# Patient Record
Sex: Female | Born: 1970 | Race: White | Hispanic: No | Marital: Single | Smoking: Former smoker
Health system: Southern US, Community
[De-identification: ages and names within clinical notes are randomized; demographics above are authoritative.]

## PROBLEM LIST (undated history)

## (undated) DIAGNOSIS — E039 Hypothyroidism, unspecified: Secondary | ICD-10-CM

## (undated) DIAGNOSIS — N901 Moderate vulvar dysplasia: Secondary | ICD-10-CM

## (undated) DIAGNOSIS — B20 Human immunodeficiency virus [HIV] disease: Secondary | ICD-10-CM

## (undated) DIAGNOSIS — Z21 Asymptomatic human immunodeficiency virus [HIV] infection status: Secondary | ICD-10-CM

## (undated) DIAGNOSIS — K219 Gastro-esophageal reflux disease without esophagitis: Secondary | ICD-10-CM

## (undated) HISTORY — DX: Moderate vulvar dysplasia: N90.1

## (undated) HISTORY — PX: CHOLECYSTECTOMY: SHX55

## (undated) HISTORY — PX: TUBAL LIGATION: SHX77

## (undated) HISTORY — PX: APPENDECTOMY: SHX54

## (undated) HISTORY — PX: ABDOMINAL HYSTERECTOMY: SHX81

## (undated) HISTORY — PX: LAPAROSCOPIC SUPRACERVICAL HYSTERECTOMY: SHX5399

---

## 2006-09-26 ENCOUNTER — Ambulatory Visit: Payer: Self-pay | Admitting: Internal Medicine

## 2006-10-18 ENCOUNTER — Ambulatory Visit: Payer: Self-pay | Admitting: General Surgery

## 2009-07-11 IMAGING — US ABDOMEN ULTRASOUND
1 series · 17 of 25 positions shown · non-contrast
Comparison: none

REASON FOR EXAM: Abdominal Pain
COMMENTS:

[Series 1: abdomen ultrasound · 17 of 71 slices shown]
[im 1/71]
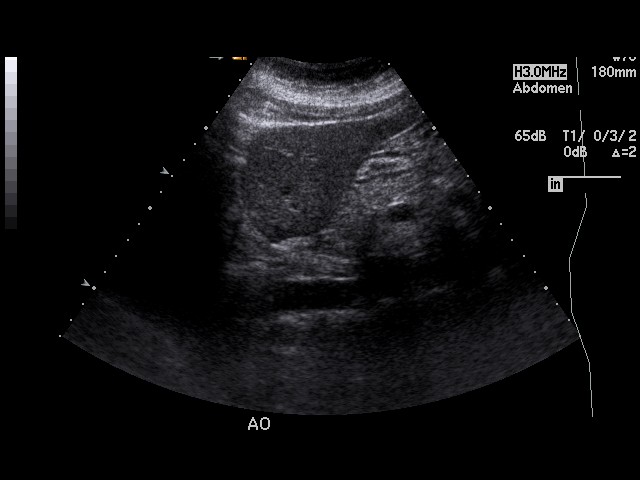
[im 6/71]
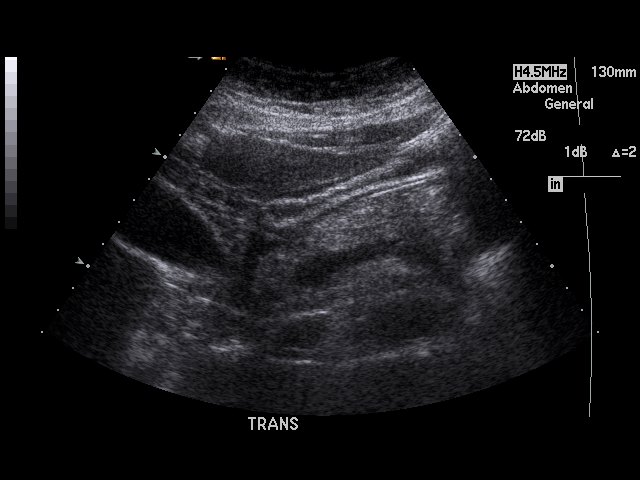
[im 9/71]
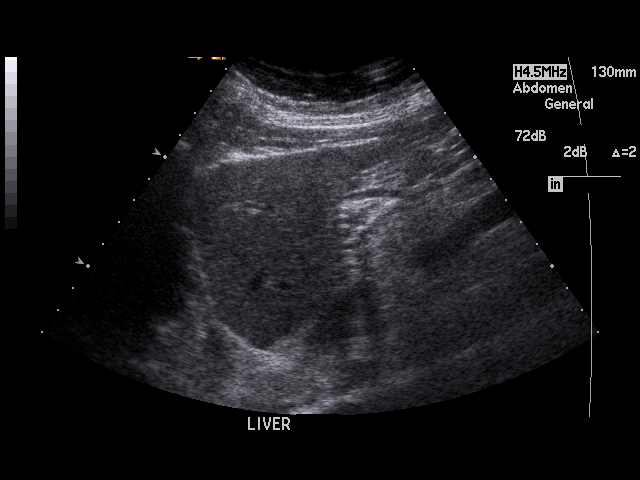
[im 15/71]
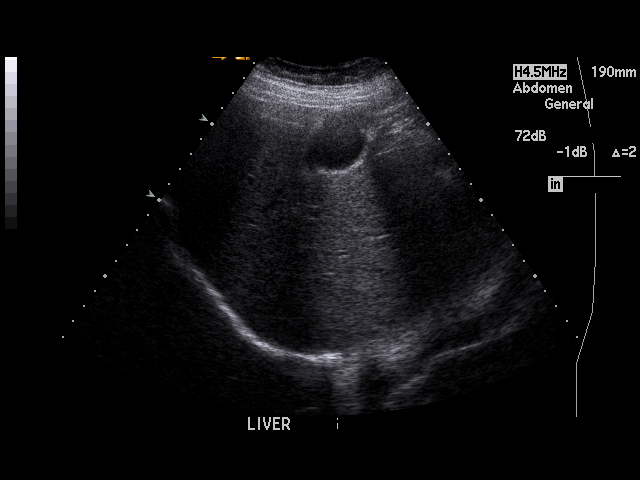
[im 18/71]
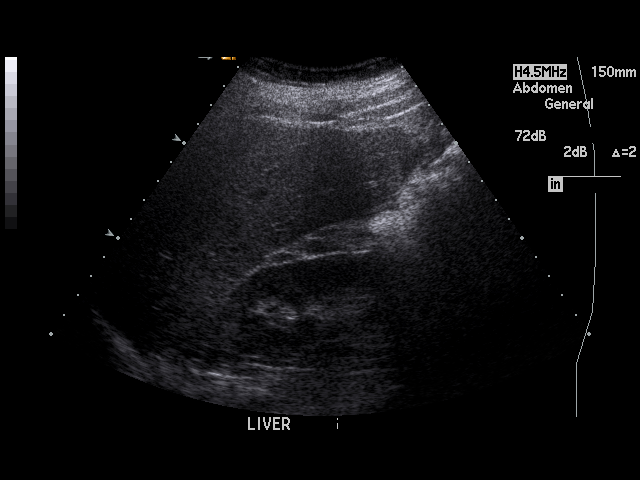
[im 24/71]
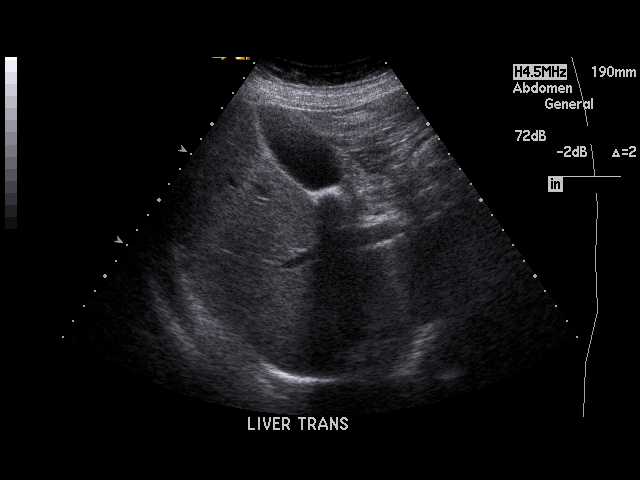
[im 27/71]
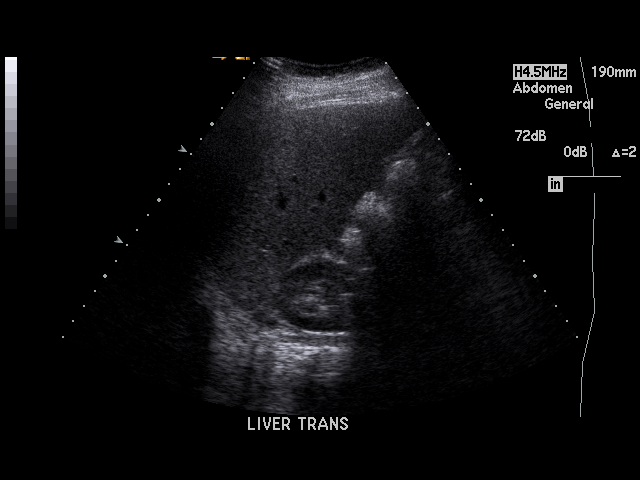
[im 33/71]
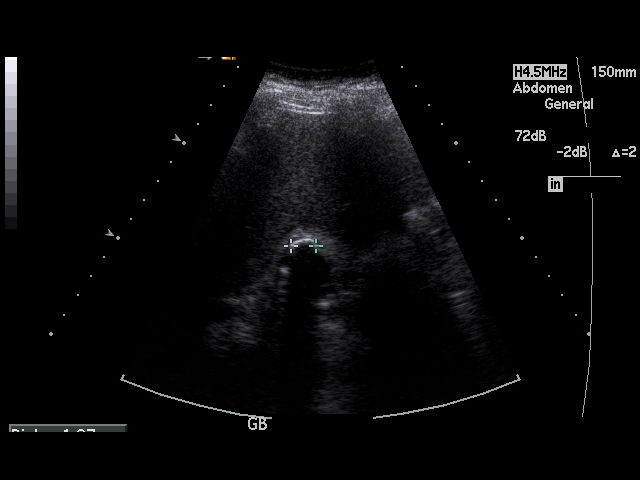
[im 36/71]
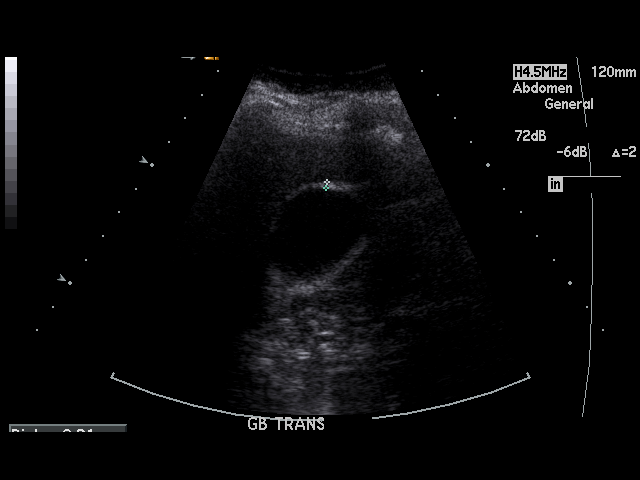
[im 38/71]
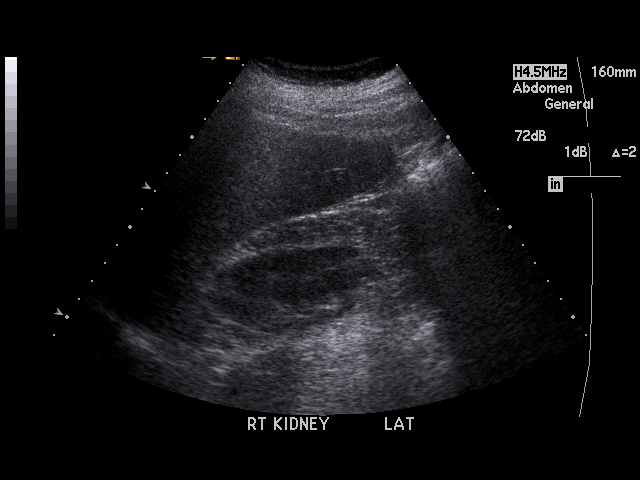
[im 44/71]
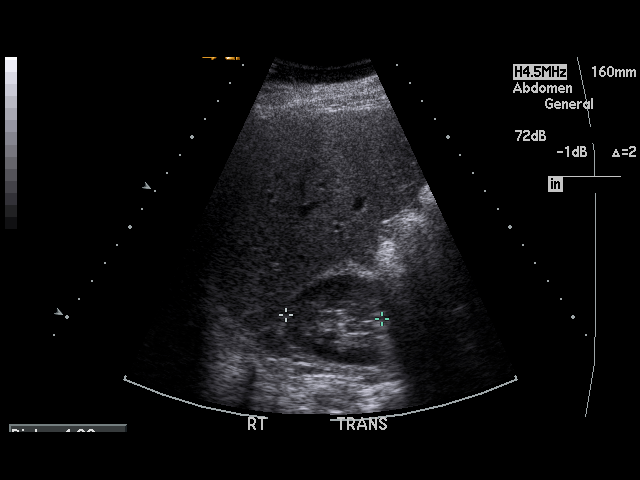
[im 47/71]
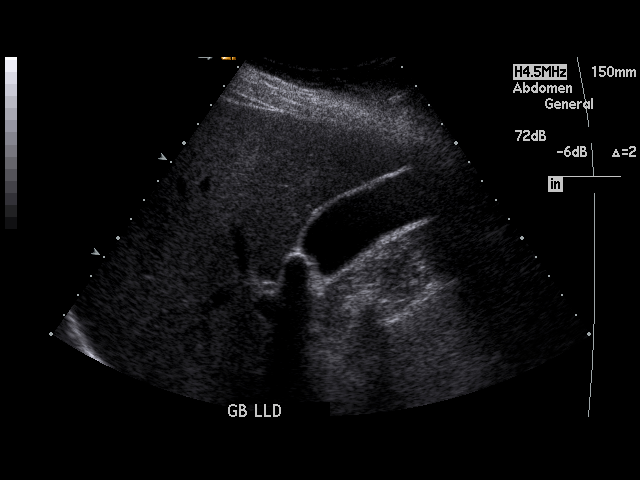
[im 53/71]
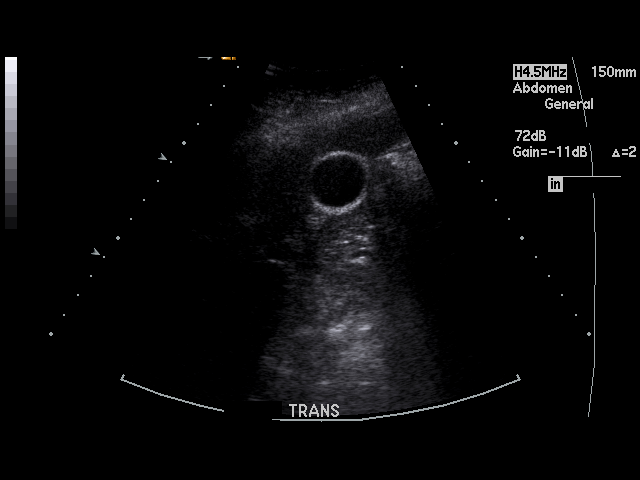
[im 56/71]
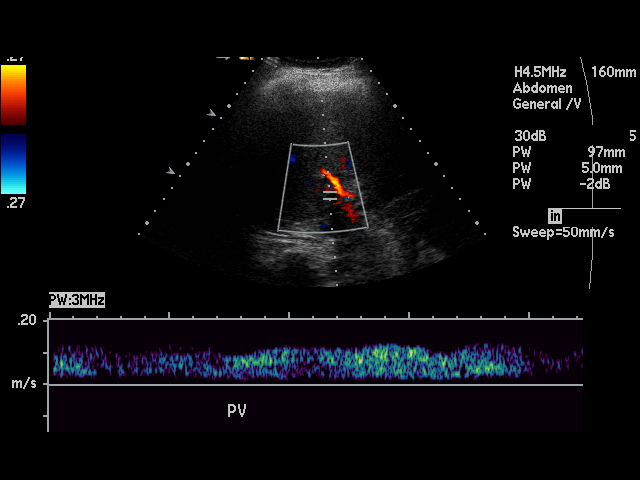
[im 62/71]
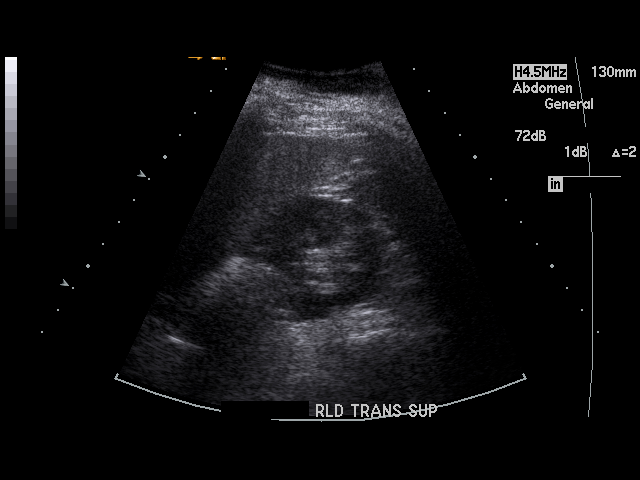
[im 65/71]
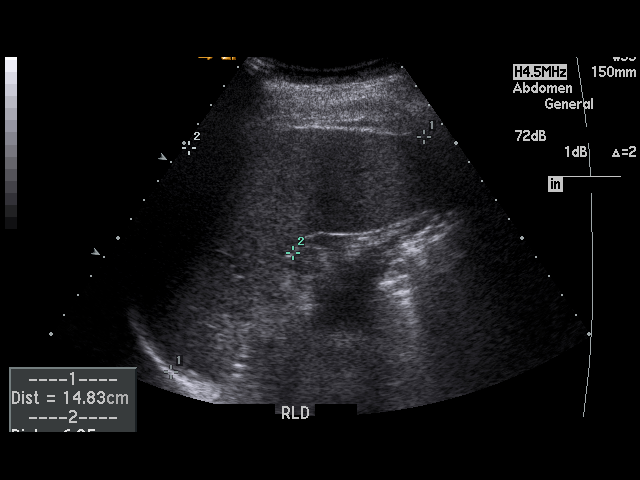
[im 71/71]
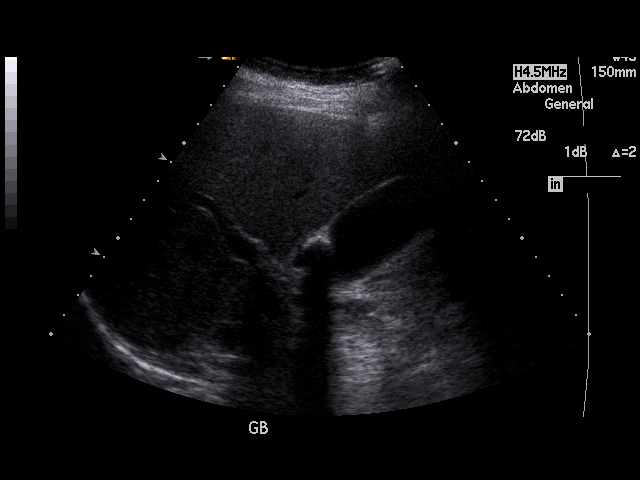

[17 of 25 positions shown; findings below may reference images not displayed]

PROCEDURE:     US  - US ABDOMEN GENERAL SURVEY  - September 26, 2006  [DATE]

RESULT:     The liver, abdominal aorta and pancreas are normal in
appearance. The spleen is enlarged. The spleen measures 14.83 cm x 7.93 cm.
No focal splenic mass lesions are noted. There is observed an echodensity in
the neck of the gallbladder. This does not move as the patient changes
position and may be lodged in the neck or possibly lodging is imminent.
Currently, there is no thickening of the gallbladder wall to indicate acute
cholecystitis. The common bile duct measures 4.7 mm in diameter, which is
within normal limits. The kidneys show no hydronephrosis. There is no
ascites.
IMPRESSION: 1. Cholelithiasis. There is a stone in the gallbladder neck which does not
move as the patient changes position.
2. No thickening of the gallbladder wall is currently seen.
3. Splenomegaly.

## 2011-06-24 DIAGNOSIS — J309 Allergic rhinitis, unspecified: Secondary | ICD-10-CM | POA: Insufficient documentation

## 2014-01-07 ENCOUNTER — Ambulatory Visit: Payer: Self-pay | Admitting: Obstetrics and Gynecology

## 2014-01-07 LAB — COMPREHENSIVE METABOLIC PANEL
Albumin: 4.1 g/dL (ref 3.4–5.0)
Alkaline Phosphatase: 34 U/L — ABNORMAL LOW
Anion Gap: 5 — ABNORMAL LOW (ref 7–16)
BUN: 7 mg/dL (ref 7–18)
Bilirubin,Total: 1.1 mg/dL — ABNORMAL HIGH (ref 0.2–1.0)
Calcium, Total: 8.9 mg/dL (ref 8.5–10.1)
Chloride: 109 mmol/L — ABNORMAL HIGH (ref 98–107)
Co2: 26 mmol/L (ref 21–32)
Creatinine: 0.82 mg/dL (ref 0.60–1.30)
Glucose: 104 mg/dL — ABNORMAL HIGH (ref 65–99)
Osmolality: 278 (ref 275–301)
Potassium: 3.8 mmol/L (ref 3.5–5.1)
SGOT(AST): 12 U/L — ABNORMAL LOW (ref 15–37)
SGPT (ALT): 21 U/L
SODIUM: 140 mmol/L (ref 136–145)
Total Protein: 7.4 g/dL (ref 6.4–8.2)

## 2014-01-07 LAB — APTT: Activated PTT: 26.8 secs (ref 23.6–35.9)

## 2014-01-07 LAB — HEMOGLOBIN: HGB: 15.2 g/dL (ref 12.0–16.0)

## 2014-01-07 LAB — PROTIME-INR
INR: 1.1
Prothrombin Time: 14.3 secs (ref 11.5–14.7)

## 2014-01-13 ENCOUNTER — Ambulatory Visit: Payer: Self-pay | Admitting: Obstetrics and Gynecology

## 2014-01-14 LAB — BASIC METABOLIC PANEL
Anion Gap: 9 (ref 7–16)
BUN: 7 mg/dL (ref 7–18)
CALCIUM: 8.6 mg/dL (ref 8.5–10.1)
CHLORIDE: 108 mmol/L — AB (ref 98–107)
Co2: 21 mmol/L (ref 21–32)
Creatinine: 0.98 mg/dL (ref 0.60–1.30)
EGFR (African American): >60
EGFR (Non-African Amer.): >60
GLUCOSE: 163 mg/dL — AB (ref 65–99)
Osmolality: 277 (ref 275–301)
Potassium: 4.3 mmol/L (ref 3.5–5.1)
Sodium: 138 mmol/L (ref 136–145)

## 2014-01-14 LAB — HEMATOCRIT: HCT: 41.4 % (ref 35.0–47.0)

## 2014-05-24 NOTE — Op Note (Signed)
PATIENT NAME:  Jordan Frank, [name] MR#:  [id number] DATE OF BIRTH:  September 24, 1970  DATE OF PROCEDURE:  01/13/2014  PREOPERATIVE DIAGNOSES :  1.  Menorrhagia.  2.  Failed endometrial ablation.   POSTOPERATIVE DIAGNOSES :  1.  Menorrhagia.  2.  Failed endometrial ablation.   PROCEDURES:  1.  Laparoscopic supracervical hysterectomy.  2.  Bilateral salpingectomy.   ANESTHESIA:  General endotracheal anesthesia.   SURGEON: Suzy Bouchardhomas J. Schermerhorn, MD.     FIRST ASSISTANT: Cline CoolsBethany E. Beasley, MD.    INDICATIONS: This is a 44 year old gravida 3, para 2  patient with menorrhagia and dysmenorrhea. She is status post a NovaSure endometrial ablation which has not worked. The patient has also failed Depo-Provera and she desires definitive surgical intervention.   DESCRIPTION OF PROCEDURE: After adequate general endotracheal anesthesia the patient was placed in the dorsal supine position. [facility]s [facility]. The abdomen, perineum, and vagina were prepped and draped in normal sterile fashion. Speculum was placed in the vagina and a single-tooth tenaculum was applied on the anterior cervix and a uterine sound was placed into the endometrial cavity. The two were anchored together with Steri-Strips to be used for uterine manipulation during the procedure. A Foley catheter was placed yielding 75 mL clear urine. Gloves were changed and a 12 mm infraumbilical incision was made after injecting with 0.5% Marcaine. The laparoscope was advanced into the abdominal cavity under direct visualization with the Optiview cannula. No difficulty gaining access. The patient's abdomen was insufflated with carbon dioxide. An 11 mm port was placed at the left lower quadrant 3 cm medial to the left anterior iliac spine, under direct visualization the port was placed. There were several adhesions in the right lower quadrant resulting most likely from prior appendectomy.  Harmonic scalpel was brought up and these were  taken down sharply. The third port was placed in the right lower quadrant again 3 cm medial to the right anterior iliac spine and under direct visualization the 11 mm trocar was advanced without difficulty. Uterus was then grasped at the left cornua and placed on traction medially and the round ligament was then transected with the Harmonic scalpel and the uteroovarian ligament was then transected with the Harmonic scalpel and the broad ligament were dissected to the level of the cornua where the left uterine artery was skeletonized. Kleppinger was used to cauterize the uterine and then the Harmonic was used to transect.  A similar procedure was repeated on the patient's right side. After transecting the round ligaments and the uteroovarian ligament the broad ligament was dissected and again the right uterine artery was identified and cauterized with the Kleppingers and transected with the Harmonic scalpel. The cervix was then transected at the level of the uterosacral ligaments. The uterine sound was removed and once the transection had occurred the Kleppingers were used to cauterize the remaining endocervical stump. Good hemostasis was noted. The morcellator was brought up to the operative field and morcellation took place without difficulty. Fallopian tubes were also then removed with the Harmonic scalpel without difficulty. The abdomen was copiously irrigated. Good hemostasis was noted. Pressure was lowered to 6 mmHg and good hemostasis noted. Normal peristaltic activity of each ureter was identified. The procedure was then terminated after deflating the patient's abdomen and all instruments were removed. The right and left lower port sites were closed in 2 layers with the fascial layer with 2-0 Vicryl suture and all skin incisions were closed with interrupted 4-0 Vicryl suture.  Dermabond was placed at the skin and then Tegaderm placed over top of this. The single-tooth tenaculum was removed from the anterior  cervix, there was no active bleeding. The procedure was terminated.   ESTIMATED BLOOD LOSS: 50 mL.    INTRAOPERATIVE FLUIDS:  800 mL.   The patient tolerated the procedure well and was taken to the recovery room in good condition.    ____________________________ Suzy Bouchard, MD tjs:bu D: 01/13/2014 12:41:50 ET T: 01/13/2014 14:58:18 ET JOB#: [id number]  cc: Suzy Bouchard, MD, <Dictator> Suzy Bouchard MD ELECTRONICALLY SIGNED 01/15/2014 8:55

## 2014-05-26 LAB — SURGICAL PATHOLOGY

## 2015-02-18 DIAGNOSIS — M25512 Pain in left shoulder: Secondary | ICD-10-CM | POA: Insufficient documentation

## 2016-06-20 DIAGNOSIS — M8580 Other specified disorders of bone density and structure, unspecified site: Secondary | ICD-10-CM | POA: Insufficient documentation

## 2016-12-27 DIAGNOSIS — E7800 Pure hypercholesterolemia, unspecified: Secondary | ICD-10-CM | POA: Insufficient documentation

## 2016-12-27 DIAGNOSIS — E559 Vitamin D deficiency, unspecified: Secondary | ICD-10-CM | POA: Insufficient documentation

## 2017-05-01 NOTE — H&P (Signed)
[name] is a 47 y.o. female here for Excisional vulvar biopsies  pt underwent vulvar biopsies at 6oclock introitus( lichen sclerosus} And 8 oclock , inner aspect right labia minor ( VIN2)  Pt is HIV +    Past Medical History:  has a past medical history of Abdominal pain, unspecified site, Acute upper respiratory infections of unspecified site, Allergic rhinitis, cause unspecified, Allergic state, Asymptomatic human immunodeficiency virus (HIV) infection status (CMS-HCC) (Diagnosed 1994), Cervicitis and endocervicitis, Cholelithiasis, Dysmenorrhea, Hemochromatosis carrier, Hepatitis C, History of nephrolithiasis, Lichen sclerosus (04/11/2017), Menorrhagia, Primary hypothyroidism, unspecified, Pure hypercholesterolemia (12/27/2016), Rash and other nonspecific skin eruption, Seasonal allergies, Tobacco use disorder, and Uterine polyp.  Past Surgical History:  has a past surgical history that includes Laparoscopic cholecystectomy (2009 at [facility]); Endometrial Biopsy (01/14/2013); Endometrial ablation w/ novasure (02/28/2013); Appendectomy (1983 at [facility]); Tubal ligation (Bilateral, 2005 at [facility]); Cholecystectomy (10/2011); and Hysterectomy. Family History: family history includes Cancer in her other; Diabetes type II in her father and other; Heart disease in her other; High blood pressure (Hypertension) in her other; Thyroid disease in her other. Social History:  reports that she has been smoking cigarettes.  She has a 20.00 pack-year smoking history. She has never used smokeless tobacco. She reports that she drank alcohol. She reports that she does not use drugs. OB/GYN History:          OB History    Gravida  3   Para  2   Term  2   Preterm      AB  1   Living  2     SAB      TAB      Ectopic      Molar      Multiple      Live Births  2          Allergies: is allergic to amoxicillin; ciprofloxacin; codeine; and penicillins. Medications:  Current Outpatient  Medications:  .  clobetasol (TEMOVATE) 0.05 % ointment, Apply topically 2 (two) times daily, Disp: 45 g, Rfl: 1 .  GENVOYA 150-150-200-10 mg tablet, TAKE 1 TABLET BY MOUTH ONCE DAILY., Disp: 30 tablet, Rfl: 10 .  levothyroxine (SYNTHROID, LEVOTHROID) 125 MCG tablet, Take 1 tablet (125 mcg total) by mouth once daily Take on an empty stomach with a glass of water at least 30-60 minutes before breakfast., Disp: 90 tablet, Rfl: 3  Review of Systems: General:                      No fatigue or weight loss Eyes:                           No vision changes Ears:                            No hearing difficulty Respiratory:                No cough or shortness of breath Pulmonary:                  No asthma or shortness of breath Cardiovascular:           No chest pain, palpitations, dyspnea on exertion Gastrointestinal:          No abdominal bloating, chronic diarrhea, constipations, masses, pain or hematochezia Genitourinary:             No hematuria, dysuria,  abnormal vaginal discharge, pelvic pain, Menometrorrhagia Lymphatic:                   No swollen lymph nodes Musculoskeletal:         No muscle weakness Neurologic:                  No extremity weakness, syncope, seizure disorder Psychiatric:                  No history of depression, delusions or suicidal/homicidal ideation    Exam:      Vitals:   04/17/17 1621  BP: 119/87  Pulse: 94    Body mass index is 33.33 kg/m.  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 PapIG, HPV, rfx 16/18 - [facility]    If abnormal ok to run genotyping 16/18. Reflex code is [id number]. Dx code is [phone]    Return if symptoms worsen or fail to improve, for preop.  Vilma PraderHOMAS JANSE Chris Cripps, MD

## 2017-05-04 ENCOUNTER — Encounter: Admission: RE | Admit: 2017-05-04 | Payer: BC Managed Care – PPO | Source: Ambulatory Visit

## 2017-05-04 HISTORY — DX: Asymptomatic human immunodeficiency virus (hiv) infection status: Z21

## 2017-05-04 HISTORY — DX: Human immunodeficiency virus (HIV) disease: B20

## 2017-05-04 HISTORY — DX: Hypothyroidism, unspecified: E03.9

## 2017-05-05 ENCOUNTER — Other Ambulatory Visit: Payer: Self-pay

## 2017-05-05 ENCOUNTER — Encounter
Admission: RE | Admit: 2017-05-05 | Discharge: 2017-05-05 | Disposition: A | Discharge status: Home / Self Care | Payer: BC Managed Care – PPO | Source: Ambulatory Visit | Attending: Obstetrics and Gynecology | Admitting: Obstetrics and Gynecology

## 2017-05-05 ENCOUNTER — Encounter: Payer: Self-pay | Admitting: *Deleted

## 2017-05-05 HISTORY — DX: Gastro-esophageal reflux disease without esophagitis: K21.9

## 2017-05-05 NOTE — Patient Instructions (Signed)
Your procedure is scheduled on: 05-12-17 FRIDAY Report to Same Day Surgery 2nd floor medical mall [facility](Medical Mall Entrance-take elevator on left to 2nd floor.  Check in with surgery information desk.) To find out your arrival time please call [phone] between 1PM - 3PM on 05-11-17 THURSDAY  Remember: Instructions that are not followed completely may result in serious medical risk, up to and including death, or upon the discretion of your surgeon and anesthesiologist your surgery may need to be rescheduled.    _x___ 1. Do not eat food after midnight the night before your procedure. NO GUM OR CANDY AFTER MIDNIGHT.  You may drink clear liquids up to 2 hours before you are scheduled to arrive at the hospital for your procedure.  Do not drink clear liquids within 2 hours of your scheduled arrival to the hospital.  Clear liquids include  --Water or Apple juice without pulp  --Clear carbohydrate beverage such as ClearFast or Gatorade  --Black Coffee or Clear Tea (No milk, no creamers, do not add anything to  the coffee or Tea     __x__ 2. No Alcohol for 24 hours before or after surgery.   __x__3. No Smoking or e-cigarettes for 24 prior to surgery.  Do not use any chewable tobacco products for at least 6 hour prior to surgery   ____  4. Bring all medications with you on the day of surgery if instructed.    __x__ 5. Notify your doctor if there is any change in your medical condition     (cold, fever, infections).    x___6. On the morning of surgery brush your teeth with toothpaste and water.  You may rinse your mouth with mouth wash if you wish.  Do not swallow any toothpaste or mouthwash.   Do not wear jewelry, make-up, hairpins, clips or nail polish.  Do not wear lotions, powders, or perfumes. You may wear deodorant.  Do not shave 48 hours prior to surgery. Men may shave face and neck.  Do not bring valuables to the hospital.    [facility] is not responsible for any belongings or  valuables.               Contacts, dentures or bridgework may not be worn into surgery.  Leave your suitcase in the car. After surgery it may be brought to your room.  For patients admitted to the hospital, discharge time is determined by your treatment team.  _  Patients discharged the day of surgery will not be allowed to drive home.  You will need someone to drive you home and stay with you the night of your procedure.    Please read over the following fact sheets that you were given:   [facility] and or MRSA Information   _x___ TAKE THE FOLLOWING MEDICATION THE MORNING OF SURGERY. These include:  1. LEVOTHYROXINE  2.  3.  4.  5.  6.  ____Fleets enema or Magnesium Citrate as directed.   ____ Use CHG Soap or sage wipes as directed on instruction sheet   ____ Use inhalers on the day of surgery and bring to hospital day of surgery  ____ Stop Metformin and Janumet 2 days prior to surgery.    ____ Take 1/2 of usual insulin dose the night before surgery and none on the morning surgery.   ____ Follow recommendations from Cardiologist, Pulmonologist or PCP regarding stopping Aspirin, Coumadin, Plavix ,Eliquis, Effient, or Pradaxa, and Pletal.  X____Stop Anti-inflammatories such  as Advil, Aleve, Ibuprofen, Motrin, Naproxen, Naprosyn, Goodies powders or aspirin products NOW-OK to take Tylenol    ____ Stop supplements until after surgery.     ____ Bring C-Pap to the hospital.    

## 2017-05-11 ENCOUNTER — Encounter
Admission: RE | Admit: 2017-05-11 | Discharge: 2017-05-11 | Disposition: A | Discharge status: Home / Self Care | Payer: BC Managed Care – PPO | Source: Ambulatory Visit | Attending: Obstetrics and Gynecology | Admitting: Obstetrics and Gynecology

## 2017-05-11 DIAGNOSIS — Z885 Allergy status to narcotic agent status: Secondary | ICD-10-CM | POA: Diagnosis not present

## 2017-05-11 DIAGNOSIS — Z79890 Hormone replacement therapy (postmenopausal): Secondary | ICD-10-CM | POA: Diagnosis not present

## 2017-05-11 DIAGNOSIS — Z21 Asymptomatic human immunodeficiency virus [HIV] infection status: Secondary | ICD-10-CM | POA: Diagnosis not present

## 2017-05-11 DIAGNOSIS — E039 Hypothyroidism, unspecified: Secondary | ICD-10-CM | POA: Diagnosis not present

## 2017-05-11 DIAGNOSIS — Z880 Allergy status to penicillin: Secondary | ICD-10-CM | POA: Diagnosis not present

## 2017-05-11 DIAGNOSIS — Z79899 Other long term (current) drug therapy: Secondary | ICD-10-CM | POA: Diagnosis not present

## 2017-05-11 DIAGNOSIS — L900 Lichen sclerosus et atrophicus: Secondary | ICD-10-CM | POA: Diagnosis not present

## 2017-05-11 DIAGNOSIS — N901 Moderate vulvar dysplasia: Secondary | ICD-10-CM | POA: Diagnosis not present

## 2017-05-11 DIAGNOSIS — Z881 Allergy status to other antibiotic agents status: Secondary | ICD-10-CM | POA: Diagnosis not present

## 2017-05-11 LAB — CBC
HCT: 41.7 % (ref 35.0–47.0)
Hemoglobin: 15 g/dL (ref 12.0–16.0)
MCH: 35.8 pg — ABNORMAL HIGH (ref 26.0–34.0)
MCHC: 36 g/dL (ref 32.0–36.0)
MCV: 99.5 fL (ref 80.0–100.0)
PLATELETS: 257 10*3/uL (ref 150–440)
RBC: 4.19 MIL/uL (ref 3.80–5.20)
RDW: 12.6 % (ref 11.5–14.5)
WBC: 8.3 10*3/uL (ref 3.6–11.0)

## 2017-05-11 LAB — BASIC METABOLIC PANEL
Anion gap: 7 (ref 5–15)
BUN: 12 mg/dL (ref 6–20)
CO2: 24 mmol/L (ref 22–32)
CREATININE: 0.76 mg/dL (ref 0.44–1.00)
Calcium: 9.3 mg/dL (ref 8.9–10.3)
Chloride: 104 mmol/L (ref 101–111)
Glucose, Bld: 96 mg/dL (ref 65–99)
POTASSIUM: 3.8 mmol/L (ref 3.5–5.1)
SODIUM: 135 mmol/L (ref 135–145)

## 2017-05-11 LAB — TYPE AND SCREEN
ABO/RH(D): A NEG
ANTIBODY SCREEN: NEGATIVE

## 2017-05-12 ENCOUNTER — Ambulatory Visit: Payer: BC Managed Care – PPO | Admitting: Certified Registered"

## 2017-05-12 ENCOUNTER — Other Ambulatory Visit: Payer: Self-pay

## 2017-05-12 ENCOUNTER — Encounter
Admission: RE | Disposition: A | Discharge status: Home / Self Care | Payer: Self-pay | Source: Ambulatory Visit | Attending: Obstetrics and Gynecology

## 2017-05-12 ENCOUNTER — Ambulatory Visit
Admission: RE | Admit: 2017-05-12 | Discharge: 2017-05-12 | Disposition: A | Discharge status: Home / Self Care | Payer: BC Managed Care – PPO | Source: Ambulatory Visit | Attending: Obstetrics and Gynecology | Admitting: Obstetrics and Gynecology

## 2017-05-12 DIAGNOSIS — Z79899 Other long term (current) drug therapy: Secondary | ICD-10-CM | POA: Insufficient documentation

## 2017-05-12 DIAGNOSIS — N901 Moderate vulvar dysplasia: Secondary | ICD-10-CM | POA: Diagnosis not present

## 2017-05-12 DIAGNOSIS — Z880 Allergy status to penicillin: Secondary | ICD-10-CM | POA: Insufficient documentation

## 2017-05-12 DIAGNOSIS — Z21 Asymptomatic human immunodeficiency virus [HIV] infection status: Secondary | ICD-10-CM | POA: Insufficient documentation

## 2017-05-12 DIAGNOSIS — Z885 Allergy status to narcotic agent status: Secondary | ICD-10-CM | POA: Insufficient documentation

## 2017-05-12 DIAGNOSIS — Z79890 Hormone replacement therapy (postmenopausal): Secondary | ICD-10-CM | POA: Insufficient documentation

## 2017-05-12 DIAGNOSIS — Z881 Allergy status to other antibiotic agents status: Secondary | ICD-10-CM | POA: Insufficient documentation

## 2017-05-12 DIAGNOSIS — L900 Lichen sclerosus et atrophicus: Secondary | ICD-10-CM | POA: Insufficient documentation

## 2017-05-12 DIAGNOSIS — E039 Hypothyroidism, unspecified: Secondary | ICD-10-CM | POA: Insufficient documentation

## 2017-05-12 HISTORY — PX: LESION REMOVAL: SHX5196

## 2017-05-12 SURGERY — EXCISION, LESION, VAGINA
Anesthesia: General

## 2017-05-12 MED ORDER — PROPOFOL 10 MG/ML IV BOLUS
INTRAVENOUS | Status: AC
  Filled 2017-05-12: qty 20

## 2017-05-12 MED ORDER — LIDOCAINE-EPINEPHRINE 1 %-1:100000 IJ SOLN
INTRAMUSCULAR | Status: AC
  Filled 2017-05-12: qty 1

## 2017-05-12 MED ORDER — LIDOCAINE-EPINEPHRINE (PF) 1 %-1:200000 IJ SOLN
INTRAMUSCULAR | Status: AC
  Filled 2017-05-12: qty 30

## 2017-05-12 MED ORDER — SEVOFLURANE IN SOLN
RESPIRATORY_TRACT | Status: AC
  Filled 2017-05-12: qty 250

## 2017-05-12 MED ORDER — CEFAZOLIN SODIUM-DEXTROSE 1-4 GM/50ML-% IV SOLN
INTRAVENOUS | Status: DC | PRN
  Administered 2017-05-12: 1 g via INTRAVENOUS

## 2017-05-12 MED ORDER — FAMOTIDINE 20 MG PO TABS
20.0000 mg | ORAL_TABLET | Freq: Once | ORAL | Status: AC
  Administered 2017-05-12: 20 mg via ORAL

## 2017-05-12 MED ORDER — DEXAMETHASONE SODIUM PHOSPHATE 10 MG/ML IJ SOLN
INTRAMUSCULAR | Status: DC | PRN
  Administered 2017-05-12: 10 mg via INTRAVENOUS

## 2017-05-12 MED ORDER — ACETAMINOPHEN 10 MG/ML IV SOLN
INTRAVENOUS | Status: DC | PRN
Start: 2017-05-12 — End: 2017-05-12
  Administered 2017-05-12: 1000 mg via INTRAVENOUS

## 2017-05-12 MED ORDER — HYDROMORPHONE HCL 1 MG/ML IJ SOLN
INTRAMUSCULAR | Status: DC | PRN
  Administered 2017-05-12: 1 mg via INTRAVENOUS

## 2017-05-12 MED ORDER — ACETIC ACID 3 % SOLN
Status: AC
  Filled 2017-05-12: qty 500

## 2017-05-12 MED ORDER — GLYCOPYRROLATE 0.2 MG/ML IJ SOLN
INTRAMUSCULAR | Status: DC | PRN
  Administered 2017-05-12: 0.1 mg via INTRAVENOUS

## 2017-05-12 MED ORDER — MIDAZOLAM HCL 2 MG/2ML IJ SOLN
INTRAMUSCULAR | Status: DC | PRN
  Administered 2017-05-12: 4 mg via INTRAVENOUS

## 2017-05-12 MED ORDER — ONDANSETRON HCL 4 MG/2ML IJ SOLN
INTRAMUSCULAR | Status: DC | PRN
  Administered 2017-05-12: 4 mg via INTRAVENOUS

## 2017-05-12 MED ORDER — ONDANSETRON HCL 4 MG/2ML IJ SOLN
INTRAMUSCULAR | Status: AC
Start: 2017-05-12 — End: 2017-05-12
  Filled 2017-05-12: qty 2

## 2017-05-12 MED ORDER — FENTANYL CITRATE (PF) 100 MCG/2ML IJ SOLN: 25.0000 ug | INTRAMUSCULAR | Status: DC | PRN

## 2017-05-12 MED ORDER — PROPOFOL 10 MG/ML IV BOLUS
INTRAVENOUS | Status: DC | PRN
  Administered 2017-05-12: 200 mg via INTRAVENOUS

## 2017-05-12 MED ORDER — LACTATED RINGERS IV SOLN
INTRAVENOUS | Status: DC
  Administered 2017-05-12 (×2): via INTRAVENOUS

## 2017-05-12 MED ORDER — DIPHENHYDRAMINE HCL 50 MG/ML IJ SOLN
INTRAMUSCULAR | Status: DC | PRN
  Administered 2017-05-12: 12.5 mg via INTRAVENOUS

## 2017-05-12 MED ORDER — FAMOTIDINE 20 MG PO TABS
ORAL_TABLET | ORAL | Status: AC
  Administered 2017-05-12: 20 mg via ORAL
  Filled 2017-05-12: qty 1

## 2017-05-12 MED ORDER — HYDROMORPHONE HCL 1 MG/ML IJ SOLN
INTRAMUSCULAR | Status: AC
  Filled 2017-05-12: qty 1

## 2017-05-12 MED ORDER — MIDAZOLAM HCL 2 MG/2ML IJ SOLN
INTRAMUSCULAR | Status: AC
  Filled 2017-05-12: qty 4

## 2017-05-12 MED ORDER — GLYCOPYRROLATE 0.2 MG/ML IJ SOLN
INTRAMUSCULAR | Status: AC
  Filled 2017-05-12: qty 1

## 2017-05-12 MED ORDER — DEXAMETHASONE SODIUM PHOSPHATE 10 MG/ML IJ SOLN
INTRAMUSCULAR | Status: AC
Start: 2017-05-12 — End: 2017-05-12
  Filled 2017-05-12: qty 1

## 2017-05-12 MED ORDER — LIDOCAINE-EPINEPHRINE 1 %-1:100000 IJ SOLN
INTRAMUSCULAR | Status: DC | PRN
  Administered 2017-05-12: 12 mL

## 2017-05-12 MED ORDER — LIDOCAINE HCL (CARDIAC) 20 MG/ML IV SOLN
INTRAVENOUS | Status: DC | PRN
  Administered 2017-05-12: 50 mg via INTRAVENOUS

## 2017-05-12 MED ORDER — LIDOCAINE HCL (PF) 2 % IJ SOLN
INTRAMUSCULAR | Status: AC
  Filled 2017-05-12: qty 10

## 2017-05-12 MED ORDER — VASOPRESSIN 20 UNIT/ML IV SOLN
INTRAVENOUS | Status: AC
  Filled 2017-05-12: qty 1

## 2017-05-12 MED ORDER — ACETAMINOPHEN NICU IV SYRINGE 10 MG/ML
INTRAVENOUS | Status: AC
  Filled 2017-05-12: qty 1

## 2017-05-12 MED ORDER — SODIUM CHLORIDE 0.9 % IJ SOLN
INTRAMUSCULAR | Status: AC
  Filled 2017-05-12: qty 50

## 2017-05-12 MED ORDER — ACETIC ACID 4% SOLUTION
Status: DC | PRN
  Administered 2017-05-12: 1 via TOPICAL

## 2017-05-12 MED ORDER — ONDANSETRON HCL 4 MG/2ML IJ SOLN: 4.0000 mg | Freq: Once | INTRAMUSCULAR | Status: DC | PRN

## 2017-05-12 MED ORDER — DIPHENHYDRAMINE HCL 50 MG/ML IJ SOLN
INTRAMUSCULAR | Status: AC
  Filled 2017-05-12: qty 1

## 2017-05-12 MED ORDER — SILVER NITRATE-POT NITRATE 75-25 % EX MISC
CUTANEOUS | Status: AC
  Filled 2017-05-12: qty 5

## 2017-05-12 MED ORDER — LACTATED RINGERS IV SOLN: INTRAVENOUS | Status: DC

## 2017-05-12 SURGICAL SUPPLY — 27 items
BLADE SURG 15 STRL LF DISP TIS (BLADE) ×1 IMPLANT
BLADE SURG 15 STRL SS (BLADE) ×2
BLADE SURG SZ10 CARB STEEL (BLADE) ×3 IMPLANT
CATH ROBINSON RED A/P 16FR (CATHETERS) ×3 IMPLANT
DRAPE UNDER BUTTOCK W/FLU (DRAPES) ×3 IMPLANT
ELECT REM PT RETURN 9FT ADLT (ELECTROSURGICAL) ×3
ELECTRODE REM PT RTRN 9FT ADLT (ELECTROSURGICAL) ×1 IMPLANT
GLOVE BIO SURGEON STRL SZ8 (GLOVE) ×3 IMPLANT
GOWN STRL REUS W/ TWL LRG LVL3 (GOWN DISPOSABLE) ×1 IMPLANT
GOWN STRL REUS W/ TWL XL LVL3 (GOWN DISPOSABLE) ×1 IMPLANT
GOWN STRL REUS W/TWL LRG LVL3 (GOWN DISPOSABLE) ×2
GOWN STRL REUS W/TWL XL LVL3 (GOWN DISPOSABLE) ×2
KIT TURNOVER CYSTO (KITS) ×3 IMPLANT
LABEL OR SOLS (LABEL) ×3 IMPLANT
NEEDLE HYPO 22GX1.5 SAFETY (NEEDLE) ×3 IMPLANT
NS IRRIG 500ML POUR BTL (IV SOLUTION) ×3 IMPLANT
PACK BASIN MINOR ARMC (MISCELLANEOUS) ×3 IMPLANT
PAD OB MATERNITY 4.3X12.25 (PERSONAL CARE ITEMS) ×3 IMPLANT
PAD PREP 24X41 OB/GYN DISP (PERSONAL CARE ITEMS) ×3 IMPLANT
SPONGE XRAY 4X4 16PLY STRL (MISCELLANEOUS) ×3 IMPLANT
SUT VIC AB 3-0 SH 27 (SUTURE) ×6
SUT VIC AB 3-0 SH 27X BRD (SUTURE) ×3 IMPLANT
SUT VIC AB 4-0 FS2 27 (SUTURE) IMPLANT
SUT VIC AB 4-0 SH 27 (SUTURE) ×4
SUT VIC AB 4-0 SH 27XANBCTRL (SUTURE) ×2 IMPLANT
SUT VICRYL AB 3-0 FS1 BRD 27IN (SUTURE) ×3 IMPLANT
SYR CONTROL 10ML (SYRINGE) ×3 IMPLANT

## 2017-05-12 NOTE — Anesthesia Post-op Follow-up Note (Signed)
Anesthesia QCDR form completed.        

## 2017-05-12 NOTE — Anesthesia Preprocedure Evaluation (Signed)
Anesthesia Evaluation  Patient identified by MRN, date of birth, ID band Patient awake    Reviewed: Allergy & Precautions, NPO status , Patient's Chart, lab work & pertinent test results  Airway Mallampati: III  TM Distance: <3 FB     Dental  (+) Chipped, Caps   Pulmonary former smoker,    Pulmonary exam normal        Cardiovascular negative cardio ROS Normal cardiovascular exam     Neuro/Psych negative neurological ROS  negative psych ROS   GI/Hepatic GERD  Controlled,  Endo/Other  Hypothyroidism   Renal/GU   negative genitourinary   Musculoskeletal negative musculoskeletal ROS (+)   Abdominal Normal abdominal exam  (+)   Peds negative pediatric ROS (+)  Hematology negative hematology ROS (+)   Anesthesia Other Findings   Reproductive/Obstetrics                             Anesthesia Physical Anesthesia Plan  ASA: III  Anesthesia Plan: General   Post-op Pain Management:    Induction: Intravenous  PONV Risk Score and Plan:   Airway Management Planned: LMA  Additional Equipment:   Intra-op Plan:   Post-operative Plan: Extubation in OR  Informed Consent: I have reviewed the patients History and Physical, chart, labs and discussed the procedure including the risks, benefits and alternatives for the proposed anesthesia with the patient or authorized representative who has indicated his/her understanding and acceptance.   Dental advisory given  Plan Discussed with: CRNA and Surgeon  Anesthesia Plan Comments:         Anesthesia Quick Evaluation

## 2017-05-12 NOTE — Op Note (Signed)
NAMKristin Bruins:  Holzman, Gyselle                ACCOUNT NO.:  [id number]  MEDICAL RECORD NO.:  [id number]  LOCATION:  [facility]                         FACILITY:  [facility]  PHYSICIAN:  Jennell Cornerhomas Myda Detwiler, MDDATE OF BIRTH:  12/01/1970  DATE OF PROCEDURE:  05/12/2017 DATE OF DISCHARGE:                              OPERATIVE REPORT   PREOPERATIVE DIAGNOSIS:  Vulvar intraepithelial neoplasia, grade 2.  POSTOPERATIVE DIAGNOSIS:  Vulvar intraepithelial neoplasia, grade 2.  PROCEDURE PERFORMED:  Wide excisional vulvar biopsies, right and left.  SURGEON:  Jennell Cornerhomas Macyn Remmert, MD  ANESTHESIA:  General endotracheal anesthesia.  FIRST ASSISTANT:  Scrub tech.  INDICATION:  A 47 year old gravida 3, para 2 patient underwent vulvar biopsies in the office for hyperpigmentation that showed VIN grade 2.  DESCRIPTION OF PROCEDURE:  After adequate general endotracheal anesthesia, the patient's was prepped and draped in normal sterile fashion.  She did receive 1 g IV Ancef prior to commencement of the case for surgical prophylaxis.  Time-out was performed.  Straight catheterization of the bladder yielded 100 mL urine.  Inner aspects of the labia minora bilaterally were marked with a marking pen to include the hyperpigmented areas.  The patient previously been prepped with acetic acid to try to better define these areas and there was no significant abnormal uptake from this.  The skin was injected with 1% lidocaine with 1:100,000 epinephrine.  A wide local incisional biopsies performed approximately 6 x 3 cm bilaterally.  The right and left tissue biopsies were tagged laterally with 2 sutures and superiorly with 1 suture.  After excision of the epithelium, Bovie was used for hemostasis and each incisional site was closed with a two-layer closure.  A 3-0 Vicryl was used in an interrupted fashion to close deeper tissues and the skin was reapproximated with a 4-0 subcuticular running suture. Good cosmetic defect.  Good  hemostasis was noted.  There were no complications.  ESTIMATED BLOOD LOSS:  15 mL.  INTRAOPERATIVE FLUIDS:  1 L.  URINE OUTPUT:  100 mL.  The patient was taken to recovery room in good condition.          ______________________________ Jennell Cornerhomas Tayra Dawe, MD     TS/MEDQ  D:  05/12/2017  T:  05/12/2017  Job:  [id number]

## 2017-05-12 NOTE — Anesthesia Procedure Notes (Signed)
Procedure Name: LMA Insertion Date/Time: 05/12/2017 7:41 AM Performed by: Sherol DadeMacMang, Anatalia Kronk H, CRNA Pre-anesthesia Checklist: Patient identified, Emergency Drugs available, Suction available, Patient being monitored and Timeout performed Patient Re-evaluated:Patient Re-evaluated prior to induction Oxygen Delivery Method: Circle system utilized Preoxygenation: Pre-oxygenation with 100% oxygen Induction Type: IV induction Ventilation: Mask ventilation without difficulty LMA: LMA inserted LMA Size: 3.5 Number of attempts: 1 Placement Confirmation: positive ETCO2,  CO2 detector and breath sounds checked- equal and bilateral Tube secured with: Tape Dental Injury: Teeth and Oropharynx as per pre-operative assessment

## 2017-05-12 NOTE — Progress Notes (Signed)
Ready for surgery vulvar biopsies . All questions answered . Proceed

## 2017-05-12 NOTE — Discharge Instructions (Signed)

## 2017-05-12 NOTE — Brief Op Note (Signed)
05/12/2017  8:56 AM  PATIENT:  Jordan CharonAlicia M Frank  47 y.o. female  PRE-OPERATIVE DIAGNOSIS:  vulvar intrepithelial neoplasia grade 2  POST-OPERATIVE DIAGNOSIS:  vulvar intraepithelial neoplasia grade 2  PROCEDURE:  Procedure(s): EXCISION VAGINAL LESION (N/A)  SURGEON:  Surgeon(s) and Role:    * Schermerhorn, Ihor Austinhomas J, MD - Primary  PHYSICIAN ASSISTANT: scrub tech  ASSISTANTS: none   ANESTHESIA:   local  EBL: 15cc  BLOOD ADMINISTERED:none  DRAINS: none   LOCAL MEDICATIONS USED:  LIDOCAINE with epi 12cc  SPECIMEN:  Source of Specimen:  vulvar biopsy right and left . both tagged lateral with 2 sutures and superior with one suter  DISPOSITION OF SPECIMEN:  PATHOLOGY  COUNTS:  YES  TOURNIQUET:  * No tourniquets in log *  DICTATION: .Other Dictation: Dictation Number verbal  PLAN OF CARE: Discharge to home after PACU  PATIENT DISPOSITION:  PACU - hemodynamically stable.   Delay start of Pharmacological VTE agent (>24hrs) due to surgical blood loss or risk of bleeding: not applicable

## 2017-05-12 NOTE — Transfer of Care (Signed)
Immediate Anesthesia Transfer of Care Note  Patient: Jordan Frank  Procedure(s) Performed: EXCISION VAGINAL LESION (N/A )  Patient Location: PACU  Anesthesia Type:General  Level of Consciousness: drowsy and patient cooperative  Airway & Oxygen Therapy: Patient Spontanous Breathing and Patient connected to face mask oxygen  Post-op Assessment: Report given to RN and Post -op Vital signs reviewed and stable  Post vital signs: Reviewed and stable  Last Vitals:  Vitals Value Taken Time  BP 122/70 05/12/2017  8:59 AM  Temp    Pulse 85 05/12/2017  9:01 AM  Resp 25 05/12/2017  9:01 AM  SpO2 94 % 05/12/2017  9:01 AM  Vitals shown include unvalidated device data.  Last Pain:  Vitals:   05/12/17 0622  TempSrc: Tympanic  PainSc:          Complications: No apparent anesthesia complications

## 2017-05-15 ENCOUNTER — Encounter: Payer: Self-pay | Admitting: Obstetrics and Gynecology

## 2017-05-16 LAB — SURGICAL PATHOLOGY

## 2017-05-16 NOTE — Anesthesia Postprocedure Evaluation (Signed)
Anesthesia Post Note  Patient: Jordan Frank  Procedure(s) Performed: EXCISION VAGINAL LESION (N/A )  Patient location during evaluation: PACU Anesthesia Type: General Level of consciousness: awake and alert and oriented Pain management: pain level controlled Vital Signs Assessment: post-procedure vital signs reviewed and stable Respiratory status: spontaneous breathing Cardiovascular status: blood pressure returned to baseline Anesthetic complications: no     Last Vitals:  Vitals:   05/12/17 0945 05/12/17 1022  BP: 121/71 110/65  Pulse: 93 84  Resp: 16 16  Temp: 36.8 C   SpO2: 93% 98%    Last Pain:  Vitals:   05/12/17 1022  TempSrc:   PainSc: 0-No pain                 Waylon Hershey

## 2017-12-21 ENCOUNTER — Ambulatory Visit: Payer: BC Managed Care – PPO | Admitting: Infectious Diseases

## 2018-02-06 ENCOUNTER — Ambulatory Visit: Payer: BC Managed Care – PPO | Attending: Infectious Diseases | Admitting: Infectious Diseases

## 2018-02-06 ENCOUNTER — Encounter: Payer: Self-pay | Admitting: Infectious Diseases

## 2018-02-06 VITALS — BP 136/87 | HR 76 | Temp 98.6°F | Wt 160.0 lb

## 2018-02-06 DIAGNOSIS — Z87891 Personal history of nicotine dependence: Secondary | ICD-10-CM | POA: Diagnosis not present

## 2018-02-06 DIAGNOSIS — Z885 Allergy status to narcotic agent status: Secondary | ICD-10-CM

## 2018-02-06 DIAGNOSIS — N901 Moderate vulvar dysplasia: Secondary | ICD-10-CM

## 2018-02-06 DIAGNOSIS — B20 Human immunodeficiency virus [HIV] disease: Secondary | ICD-10-CM

## 2018-02-06 DIAGNOSIS — Z881 Allergy status to other antibiotic agents status: Secondary | ICD-10-CM | POA: Diagnosis not present

## 2018-02-06 DIAGNOSIS — Z21 Asymptomatic human immunodeficiency virus [HIV] infection status: Secondary | ICD-10-CM | POA: Diagnosis not present

## 2018-02-06 DIAGNOSIS — Z79899 Other long term (current) drug therapy: Secondary | ICD-10-CM

## 2018-02-06 HISTORY — DX: Moderate vulvar dysplasia: N90.1

## 2018-02-06 MED ORDER — ELVITEG-COBIC-EMTRICIT-TENOFAF 150-150-200-10 MG PO TABS: 1.0000 | ORAL_TABLET | Freq: Every day | ORAL | 5 refills | Status: DC

## 2018-02-06 NOTE — Progress Notes (Signed)
NAME: Jordan Frank  DOB: 11-06-1970  MRN: [id number]  Date/Time: 02/06/2018 12:23 PM Subjective:  REASON FOR CONSULT: To engage in care for HIV PCP -Dr.Johnston ? Jordan Frank is a 48 y.o. 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 Says Nadir count was never below 200. Was followed at [facility], then [facility] clinic and last physician was Dr.Fitzgerald She has been thru many regimens Initial regimen was AZT and then gone thru many combo-  She is currently on genvoya 100% adherent Last Vl < 40 and cd4 > 1000 from Nov 2019   HAARt history AZT-during pregnancy- Multiple other meds including combivir and crixivan  Genotype ? PMH Cervicitis and endocervicitis  . Cholelithiasis  . Dysmenorrhea  . Hemochromatosis carrier  History of nephrolithiasis  . Lichen sclerosus 04/11/2017  . Menorrhagia  . Primary hypothyroidism, unspecified  . Pure hypercholesterolemia 12/27/2016  . Rash and other nonspecific skin eruption  . Seasonal allergies  . Tobacco use disorder  . Uterine polyp  . 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 ABDOMINAL HYSTERECTOMY    . APPENDECTOMY    . CHOLECYSTECTOMY    . LESION REMOVAL N/A 05/12/2017   Procedure: EXCISION VAGINAL LESION;  Surgeon: Schermerhorn, Ihor Austin, MD;  Location: [facility] ORS;  Service: Gynecology;  Laterality: N/A;  . TUBAL LIGATION      SH Ex smoker Lives with her 2 sons Works as Control and instrumentation engineer ED Sexually active with 1 partner ( father of her son) Uses condoms    No family history on file. Allergies  Allergen Reactions  . Amoxicillin Rash    Has patient had a PCN reaction causing immediate rash, facial/tongue/throat swelling, SOB or lightheadedness with hypotension: Yes Has patient had a PCN reaction causing severe rash involving mucus membranes or skin necrosis: No Has patient had a PCN reaction that  required hospitalization: No Has patient had a PCN reaction occurring within the last 10 years: No If all of the above answers are "NO", then may proceed with Cephalosporin use.   . Ciprofloxacin Rash  . Codeine Rash   ? Current Outpatient Medications  Medication Sig Dispense Refill  . Cholecalciferol (VITAMIN D) 2000 units tablet Take 2,000 Units by mouth daily.    . clobetasol ointment (TEMOVATE) 0.05 % Apply 1 application topically daily as needed for rash.  1  . Emtricitabine-Tenofovir DF (TRUVADA) 100-150 MG TABS Take 1 tablet by mouth every evening.    . hydrocortisone (ANUSOL-HC) 25 MG suppository Place 1 suppository rectally daily as needed for irritation.  1  . levocetirizine (XYZAL) 5 MG tablet Take 5 mg by mouth every evening.    Marland Kitchen levothyroxine (SYNTHROID, LEVOTHROID) 125 MCG tablet Take 125 mcg by mouth daily before breakfast.     No current facility-administered medications for this visit.     REVIEW OF SYSTEMS:  Const: negative fever, negative chills, negative weight loss Eyes: negative diplopia or visual changes, negative eye pain ENT: negative coryza, negative sore throat Resp: negative cough, hemoptysis, dyspnea Cards: negative for chest pain, palpitations, lower extremity edema GU: negative for frequency, dysuria and hematuria Skin: negative for rash and pruritus Heme: negative for easy bruising and gum/nose bleeding MS: negative for myalgias, arthralgias, back pain and muscle weakness Neurolo:negative for headaches, dizziness, vertigo, memory problems  Psych: negative for feelings of anxiety, depression  Pertinent Positives include :  Objective:  VITALS:  BP 136/87 (BP Location: Right Arm, Patient Position: Sitting, Cuff Size: Normal)   Pulse 76   Temp 98.6 F (37 C) (Oral)   Wt 160 lb (72.6 kg)   LMP 05/06/2014 (Approximate)   BMI 32.32 kg/m  PHYSICAL EXAM:  General: Alert, cooperative, no distress, appears stated age.  Head: Normocephalic, without  obvious abnormality, atraumatic. Eyes: Conjunctivae clear, anicteric sclerae. Pupils are equal Nose: Nares normal. No drainage or sinus tenderness. Throat: Lips, mucosa, and tongue normal. No Thrush Neck: Supple, symmetrical, no adenopathy, thyroid: non tender no carotid bruit and no JVD. Back: No CVA tenderness. Lungs: Clear to auscultation bilaterally. No Wheezing or Rhonchi. No rales. Heart: Regular rate and rhythm, no murmur, rub or gallop. Abdomen: Soft, non-tender,not distended. Bowel sounds normal. No masses Extremities: Extremities normal, atraumatic, no cyanosis. No edema. No clubbing Skin: No rashes or lesions. Not Jaundiced Lymph: Cervical, supraclavicular normal. Neurologic: Grossly non-focal Pertinent Labs  Health maintenance Vaccination  HepB HepA Flu-Oct 2019 Herpes zoster- HPV ______________________ labs RPR HEPC ab Lipid CMv TOXO -IGG/IgM HIV VL Cd4 quantiferon Gold GC/CHL [id number] Genotype HIV antibody  Preventive  Dental- April 2020 Colonoscopy Opthal-Feb 2020 cervical pap Mammogram- Dec 2019 N  Dexa Scan -2016 (-1.9 spine L1-L4 Hip L -1.7  Impression/Recommendation ? HIV- on genvoya- well controlled and 100% adherent to meds  Partner aware of her status and he is negative  Health maintenance need to be updated? ? ___HGSIL-VIN II- followed by Gyn  ________________________________________________ Discussed with patient Labs in 6 months and follow up in 6 months

## 2018-04-01 DIAGNOSIS — K9041 Non-celiac gluten sensitivity: Secondary | ICD-10-CM | POA: Insufficient documentation

## 2018-08-02 ENCOUNTER — Other Ambulatory Visit: Payer: Self-pay | Admitting: Licensed Clinical Social Worker

## 2018-08-02 MED ORDER — GENVOYA 150-150-200-10 MG PO TABS: 1.0000 | ORAL_TABLET | Freq: Every day | ORAL | 0 refills | Status: DC

## 2018-08-07 ENCOUNTER — Ambulatory Visit: Payer: BC Managed Care – PPO | Admitting: Infectious Diseases

## 2018-09-03 ENCOUNTER — Other Ambulatory Visit: Payer: Self-pay | Admitting: Licensed Clinical Social Worker

## 2018-09-03 DIAGNOSIS — B20 Human immunodeficiency virus [HIV] disease: Secondary | ICD-10-CM

## 2018-09-03 MED ORDER — GENVOYA 150-150-200-10 MG PO TABS: 1.0000 | ORAL_TABLET | Freq: Every day | ORAL | 1 refills | Status: DC

## 2018-09-18 ENCOUNTER — Other Ambulatory Visit: Payer: Self-pay

## 2018-09-18 ENCOUNTER — Encounter: Payer: Self-pay | Admitting: Infectious Diseases

## 2018-09-18 ENCOUNTER — Other Ambulatory Visit
Admission: RE | Admit: 2018-09-18 | Discharge: 2018-09-18 | Disposition: A | Discharge status: Home / Self Care | Payer: BC Managed Care – PPO | Source: Ambulatory Visit | Attending: Infectious Diseases | Admitting: Infectious Diseases

## 2018-09-18 ENCOUNTER — Ambulatory Visit: Payer: BC Managed Care – PPO | Attending: Infectious Diseases | Admitting: Infectious Diseases

## 2018-09-18 VITALS — BP 125/90 | HR 84 | Temp 98.0°F | Wt 159.2 lb

## 2018-09-18 DIAGNOSIS — Z79899 Other long term (current) drug therapy: Secondary | ICD-10-CM

## 2018-09-18 DIAGNOSIS — B20 Human immunodeficiency virus [HIV] disease: Secondary | ICD-10-CM

## 2018-09-18 DIAGNOSIS — Z87891 Personal history of nicotine dependence: Secondary | ICD-10-CM

## 2018-09-18 DIAGNOSIS — Z91018 Allergy to other foods: Secondary | ICD-10-CM | POA: Diagnosis not present

## 2018-09-18 DIAGNOSIS — Z885 Allergy status to narcotic agent status: Secondary | ICD-10-CM

## 2018-09-18 DIAGNOSIS — Z881 Allergy status to other antibiotic agents status: Secondary | ICD-10-CM

## 2018-09-18 LAB — COMPREHENSIVE METABOLIC PANEL
ALT: 24 U/L (ref 0–44)
AST: 17 U/L (ref 15–41)
Albumin: 4.1 g/dL (ref 3.5–5.0)
Alkaline Phosphatase: 26 U/L — ABNORMAL LOW (ref 38–126)
Anion gap: 5 (ref 5–15)
BUN: 10 mg/dL (ref 6–20)
CO2: 24 mmol/L (ref 22–32)
Calcium: 9.2 mg/dL (ref 8.9–10.3)
Chloride: 107 mmol/L (ref 98–111)
Creatinine, Ser: 0.8 mg/dL (ref 0.44–1.00)
GFR calc Af Amer: >60 mL/min (ref >60)
GFR calc non Af Amer: >60 mL/min (ref >60)
Glucose, Bld: 101 mg/dL — ABNORMAL HIGH (ref 70–99)
Potassium: 4.2 mmol/L (ref 3.5–5.1)
Sodium: 136 mmol/L (ref 135–145)
Total Bilirubin: 1 mg/dL (ref 0.3–1.2)
Total Protein: 7.3 g/dL (ref 6.5–8.1)

## 2018-09-18 LAB — HEMOGLOBIN A1C
Hgb A1c MFr Bld: 4.6 % — ABNORMAL LOW (ref 4.8–5.6)
Mean Plasma Glucose: 85.32 mg/dL

## 2018-09-18 NOTE — Progress Notes (Signed)
NAME: Jordan Frank  DOB: August 24, 1970  MRN: [id number]  Date/Time: 09/18/2018 9:21 AM Subjective:  Follow up visit for HIV First visit was Jan 2020 Since her last visit diagnosed with Gluten hypersensitivity - says she had positive lab work Currently on genvoya for HIv and 100% adherent  Need labs today   PCP -Dr.Johnston ? 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 Says Nadir count was never below 200. [facility] and last physician was Dr.Fitzgerald She has been thru many regimens Initial regimen was AZT and then gone thru many combo-  She is currently on genvoya 100% adherent Last Vl < 40 and cd4 > 1000 from Nov 2019   HAARt history AZT-during pregnancy- Multiple other meds including combivir and crixivan  Genotype ? Gluten PMH Cervicitis and endocervicitis  . Cholelithiasis  . Dysmenorrhea  . Hemochromatosis carrier  History of nephrolithiasis  . Lichen sclerosus 04/11/2017  . Menorrhagia  . Primary hypothyroidism, unspecified  . Pure hypercholesterolemia 12/27/2016  . Rash and other nonspecific skin eruption  . Seasonal allergies  . Tobacco use disorder  . Uterine polyp  . 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 ABDOMINAL HYSTERECTOMY    . APPENDECTOMY    . CHOLECYSTECTOMY    . LESION REMOVAL N/A 05/12/2017   Procedure: EXCISION VAGINAL LESION;  Surgeon: Schermerhorn, Ihor Austinhomas J, MD;  Location: [facility] ORS;  Service: Gynecology;  Laterality: N/A;  . 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 Amoxicillin Rash    Has patient had a PCN reaction causing immediate rash,  facial/tongue/throat swelling, SOB or lightheadedness with hypotension: Yes Has patient had a PCN reaction causing severe rash involving mucus membranes or skin necrosis: No Has patient had a PCN reaction that required hospitalization: No Has patient had a PCN reaction occurring within the last 10 years: No If all of the above answers are "NO", then may proceed with Cephalosporin use.   . Ciprofloxacin Rash  . Codeine Rash   ? Current Outpatient Medications  Medication Sig Dispense Refill  . Cholecalciferol (VITAMIN D) 2000 units tablet Take 2,000 Units by mouth daily.    . clobetasol ointment (TEMOVATE) 0.05 % Apply 1 application topically daily as needed for rash.  1  . elvitegravir-cobicistat-emtricitabine-tenofovir (GENVOYA) 150-150-200-10 MG TABS tablet Take 1 tablet by mouth daily with breakfast. 30 tablet 1  . hydrocortisone (ANUSOL-HC) 25 MG suppository Place 1 suppository rectally daily as needed for irritation.  1  . levocetirizine (XYZAL) 5 MG tablet Take 5 mg by mouth every evening.    Marland Kitchen. levothyroxine (SYNTHROID, LEVOTHROID) 125 MCG tablet Take 125 mcg by mouth daily before breakfast.    . Emtricitabine-Tenofovir DF (TRUVADA) 100-150 MG TABS Take 1 tablet by mouth every evening.    . methocarbamol (ROBAXIN) 500 MG tablet Take 1 tablet by mouth 4 (four) times daily.     No current facility-administered medications for this visit.     REVIEW OF SYSTEMS:  Const: negative fever, negative chills, negative weight loss Eyes: negative diplopia or visual changes, negative  eye pain ENT: negative coryza, negative sore throat Resp: negative cough, hemoptysis, dyspnea Cards: negative for chest pain, palpitations, lower extremity edema GU: negative for frequency, dysuria and hematuria GI: Had abdominal bloating and pain and once she eliminated gluten she has been doing well Skin: Was getting a rash after eating wheat Heme: negative for easy bruising and gum/nose bleeding MS: negative  for myalgias, arthralgias, back pain and muscle weakness Neurolo:negative for headaches, dizziness, vertigo, memory problems  Psych: negative for feelings of anxiety, depression   Objective:  VITALS:  BP 125/90 (BP Location: Right Arm, Patient Position: Sitting, Cuff Size: Normal)   Pulse 84   Temp 98 F (36.7 C) (Oral)   Wt 159 lb 4 oz (72.2 kg)   LMP 05/06/2014 (Approximate)   BMI 32.16 kg/m  PHYSICAL EXAM:  General: Alert, cooperative, no distress, appears stated age.  Head: Normocephalic, without obvious abnormality, atraumatic. Eyes: Conjunctivae clear, anicteric sclerae. Pupils are equal Nose: Nares normal. No drainage or sinus tenderness. Throat: Lips, mucosa, and tongue normal. No Thrush Neck: Supple, symmetrical, no adenopathy, thyroid: non tender no carotid bruit and no JVD. Back: No CVA tenderness. Lungs: Clear to auscultation bilaterally. No Wheezing or Rhonchi. No rales. Heart: Regular rate and rhythm, no murmur, rub or gallop. Abdomen: Soft, non-tender,not distended. Bowel sounds normal. No masses Extremities: Extremities normal, atraumatic, no cyanosis. No edema. No clubbing Skin: No rashes or lesions. Not Jaundiced Lymph: Cervical, supraclavicular normal. Neurologic: Grossly non-focal Pertinent Labs  Health maintenance Vaccination  HepB HepA Flu-Oct 2019 Herpes zoster- HPV ______________________ labs RPR HEPC ab Lipid CMv TOXO -IGG/IgM HIV VL Cd4 quantiferon Gold GC/CHL [id number] Genotype HIV antibody  Preventive  Dental- April 2020 Colonoscopy Opthal-Feb 2020 cervical pap Mammogram- Dec 2019 N  Dexa Scan -2016 (-1.9 spine L1-L4 Hip L -1.7  Impression/Recommendation ? HIV- on genvoya- well controlled and 100% adherent to meds we will do labs today including CBC, CMP, HIV RNA, CD4  Gluten hypersensitivity  Partner aware of her status and he is negative  Health maintenance updated.   ? ___HGSIL-VIN II- followed by Gyn   ________________________________________________ Discussed with patient Follow-up 6 months.

## 2018-09-19 LAB — T-HELPER CELLS CD4/CD8 %
% CD 4 Pos. Lymph.: 39.1 % (ref 30.8–58.5)
Absolute CD 4 Helper: 899 /uL (ref 359–1519)
Basophils Absolute: 0.1 10*3/uL (ref 0.0–0.2)
Basos: 1 %
CD3+CD4+ Cells/CD3+CD8+ Cells Bld: 0.77 — ABNORMAL LOW (ref 0.92–3.72)
CD3+CD8+ Cells # Bld: 1162 /uL — ABNORMAL HIGH (ref 109–897)
CD3+CD8+ Cells NFr Bld: 50.5 % — ABNORMAL HIGH (ref 12.0–35.5)
EOS (ABSOLUTE): 0.3 10*3/uL (ref 0.0–0.4)
Eos: 5 %
Hematocrit: 44 % (ref 34.0–46.6)
Hemoglobin: 16 g/dL — ABNORMAL HIGH (ref 11.1–15.9)
Immature Grans (Abs): 0 10*3/uL (ref 0.0–0.1)
Immature Granulocytes: 0 %
Lymphocytes Absolute: 2.3 10*3/uL (ref 0.7–3.1)
Lymphs: 35 %
MCH: 35.9 pg — ABNORMAL HIGH (ref 26.6–33.0)
MCHC: 36.4 g/dL — ABNORMAL HIGH (ref 31.5–35.7)
MCV: 99 fL — ABNORMAL HIGH (ref 79–97)
Monocytes Absolute: 0.5 10*3/uL (ref 0.1–0.9)
Monocytes: 8 %
Neutrophils Absolute: 3.5 10*3/uL (ref 1.4–7.0)
Neutrophils: 51 %
Platelets: 294 10*3/uL (ref 150–450)
RBC: 4.46 x10E6/uL (ref 3.77–5.28)
RDW: 11.8 % (ref 11.7–15.4)
WBC: 6.7 10*3/uL (ref 3.4–10.8)

## 2018-09-19 LAB — HIV-1 RNA QUANT-NO REFLEX-BLD
HIV 1 RNA Quant: <20 copies/mL
LOG10 HIV-1 RNA: UNDETERMINED log10copy/mL

## 2018-09-19 LAB — HEPATITIS C ANTIBODY: HCV Ab: 0.1 s/co ratio (ref 0.0–0.9)

## 2018-09-20 LAB — RPR: RPR Ser Ql: NONREACTIVE

## 2018-11-28 ENCOUNTER — Other Ambulatory Visit: Payer: Self-pay | Admitting: Infectious Diseases

## 2018-11-28 DIAGNOSIS — B20 Human immunodeficiency virus [HIV] disease: Secondary | ICD-10-CM

## 2018-11-28 MED ORDER — GENVOYA 150-150-200-10 MG PO TABS: 1.0000 | ORAL_TABLET | Freq: Every day | ORAL | 5 refills | Status: DC

## 2019-03-21 ENCOUNTER — Ambulatory Visit: Payer: BC Managed Care – PPO | Admitting: Infectious Diseases

## 2019-05-07 ENCOUNTER — Other Ambulatory Visit
Admission: RE | Admit: 2019-05-07 | Discharge: 2019-05-07 | Disposition: A | Discharge status: Home / Self Care | Payer: BC Managed Care – PPO | Source: Ambulatory Visit | Attending: Infectious Diseases | Admitting: Infectious Diseases

## 2019-05-07 ENCOUNTER — Encounter: Payer: Self-pay | Admitting: Infectious Diseases

## 2019-05-07 ENCOUNTER — Other Ambulatory Visit: Payer: Self-pay

## 2019-05-07 ENCOUNTER — Ambulatory Visit: Payer: BC Managed Care – PPO | Attending: Infectious Diseases | Admitting: Infectious Diseases

## 2019-05-07 VITALS — BP 123/84 | HR 84 | Temp 98.2°F | Resp 16 | Ht 59.0 in | Wt 165.0 lb

## 2019-05-07 DIAGNOSIS — Z79890 Hormone replacement therapy (postmenopausal): Secondary | ICD-10-CM | POA: Insufficient documentation

## 2019-05-07 DIAGNOSIS — L409 Psoriasis, unspecified: Secondary | ICD-10-CM | POA: Insufficient documentation

## 2019-05-07 DIAGNOSIS — B20 Human immunodeficiency virus [HIV] disease: Secondary | ICD-10-CM | POA: Diagnosis present

## 2019-05-07 DIAGNOSIS — Z148 Genetic carrier of other disease: Secondary | ICD-10-CM | POA: Insufficient documentation

## 2019-05-07 DIAGNOSIS — Z79899 Other long term (current) drug therapy: Secondary | ICD-10-CM | POA: Diagnosis not present

## 2019-05-07 DIAGNOSIS — E039 Hypothyroidism, unspecified: Secondary | ICD-10-CM | POA: Diagnosis not present

## 2019-05-07 DIAGNOSIS — Z880 Allergy status to penicillin: Secondary | ICD-10-CM | POA: Diagnosis not present

## 2019-05-07 DIAGNOSIS — Z881 Allergy status to other antibiotic agents status: Secondary | ICD-10-CM | POA: Insufficient documentation

## 2019-05-07 DIAGNOSIS — Z885 Allergy status to narcotic agent status: Secondary | ICD-10-CM | POA: Diagnosis not present

## 2019-05-07 LAB — COMPREHENSIVE METABOLIC PANEL
ALT: 28 U/L (ref 0–44)
AST: 18 U/L (ref 15–41)
Albumin: 4.4 g/dL (ref 3.5–5.0)
Alkaline Phosphatase: 26 U/L — ABNORMAL LOW (ref 38–126)
Anion gap: 8 (ref 5–15)
BUN: 12 mg/dL (ref 6–20)
CO2: 24 mmol/L (ref 22–32)
Calcium: 9.1 mg/dL (ref 8.9–10.3)
Chloride: 105 mmol/L (ref 98–111)
Creatinine, Ser: 0.71 mg/dL (ref 0.44–1.00)
GFR calc Af Amer: >60 mL/min (ref >60)
GFR calc non Af Amer: >60 mL/min (ref >60)
Glucose, Bld: 100 mg/dL — ABNORMAL HIGH (ref 70–99)
Potassium: 4.3 mmol/L (ref 3.5–5.1)
Sodium: 137 mmol/L (ref 135–145)
Total Bilirubin: 1 mg/dL (ref 0.3–1.2)
Total Protein: 7.5 g/dL (ref 6.5–8.1)

## 2019-05-07 LAB — MAGNESIUM: Magnesium: 2.2 mg/dL (ref 1.7–2.4)

## 2019-05-07 MED ORDER — GENVOYA 150-150-200-10 MG PO TABS: 1.0000 | ORAL_TABLET | Freq: Every day | ORAL | 5 refills | Status: DC

## 2019-05-07 NOTE — Progress Notes (Signed)
NAME: Jordan [name]  DOB: [birth year]  MRN: [id number]  Date/Time: 05/07/2019 11:14 AM Subjective:  Follow up visit for HIV   PCP -Dr.Johnston Since her last visit in Aug 2020 she has been doing well. No hospitalization ,no change in meds- saw dermatologist and diagnosed with psoriasis and given clobetasol cream ? Jordan [name] is a 49 y.o. 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 Says Nadir count was never below 200. Was followed at [facility], then [facility] clinic and last physician was Dr.Fitzgerald She has been thru many regimens Initial regimen was AZT and then gone thru many combo-  She is currently on genvoya 100% adherent Last Vl < 20  and cd4 899 from Aug 2020. Pt has been diagnosed with palmoplantar psoriasis and was given clobetasol by Dermatologist   Jordan [name] AZT-during pregnancy- Multiple other meds including combivir and crixivan   PMH Cervicitis and endocervicitis  . Cholelithiasis  . Dysmenorrhea  . Hemochromatosis carrier  [name] of nephrolithiasis  . Lichen sclerosus 93/79/0240  . Menorrhagia  . Primary hypothyroidism, unspecified  . Pure hypercholesterolemia 12/27/2016  . Rash and other nonspecific skin eruption  . Seasonal allergies  . Tobacco use disorder  . Uterine polyp  . 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 ABDOMINAL HYSTERECTOMY    . APPENDECTOMY    . CHOLECYSTECTOMY    . LESION REMOVAL N/A 05/12/2017   Procedure: EXCISION VAGINAL LESION;  Surgeon: Schermerhorn, Gwen Her, MD;  Location: [facility] ORS;  Service: Gynecology;  Laterality: N/A;  . 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 Amoxicillin Rash    Has patient had a PCN reaction causing immediate rash, facial/tongue/throat swelling, SOB or lightheadedness with hypotension: Yes Has patient had a PCN reaction causing severe rash involving mucus membranes or skin necrosis: No Has patient had a PCN reaction that required hospitalization: No Has patient had a PCN reaction occurring within the last 10 years: No If all of the above answers are "NO", then may proceed with Cephalosporin use.   . Ciprofloxacin Rash  . Codeine Rash   ? Current Outpatient Medications  Medication Sig Dispense Refill  . Cholecalciferol (VITAMIN D) 2000 units tablet Take 2,000 Units by mouth daily.    . clobetasol ointment (TEMOVATE) 9.73 % Apply 1 application topically daily as needed for rash.  1  . elvitegravir-cobicistat-emtricitabine-tenofovir (GENVOYA) 150-150-200-10 MG TABS tablet Take 1 tablet by mouth daily with breakfast. 30 tablet 5  . levocetirizine (XYZAL) 5 MG tablet Take 5 mg by mouth every evening.    Marland Kitchen levothyroxine (SYNTHROID, LEVOTHROID) 125 MCG tablet Take 125 mcg by mouth daily before breakfast.     No current facility-administered medications for this visit.    REVIEW OF SYSTEMS:  Const: negative fever, negative chills, negative weight loss, says she wanted to lose weight and even going on a gluten free diet did not help Eyes: negative diplopia or visual changes, negative eye pain ENT: negative coryza, negative sore throat Resp: negative cough, hemoptysis, dyspnea Cards: negative for chest pain, palpitations, lower extremity edema GU: negative for frequency, dysuria and  hematuria GI: Had abdominal bloating and pain and once she eliminated gluten she has been doing well Skin: patches of scaly skin palms and soles Heme: negative for easy bruising and gum/nose bleeding MS: negative for myalgias, arthralgias, back pain and muscle weakness Neurolo:negative for headaches, dizziness, vertigo, memory problems    Psych: negative for feelings of anxiety, depression   Objective:  VITALS:  BP 123/84   Pulse 84   Temp 98.2 F (36.8 C) (Temporal)   Resp 16   Ht 4\' 11"  (1.499 m)   Wt 165 lb (74.8 kg)   LMP 05/06/2014 (Approximate)   SpO2 98%   BMI 33.33 kg/m  PHYSICAL EXAM:  General: Looks well Head: Normocephalic, without obvious abnormality, atraumatic. Eyes: Conjunctivae clear, anicteric sclerae. Pupils are equal Nose: Nares normal. No drainage or sinus tenderness. Throat: Lips, mucosa, and tongue normal. No Thrush Neck: Supple, Lungs: Clear to auscultation bilaterally. No Wheezing or Rhonchi. No rales. Heart: Regular rate and rhythm, no murmur, rub or gallop. Abdomen: Soft, non-tender,not distended. Bowel sounds normal. No masses Extremities: edema ankles 1+ Skin: dry discoid patch over palm and sole Lymph: Cervical, supraclavicular normal. Neurologic: Grossly non-focal Pertinent Labs  Health maintenance Vaccination  HepB HepA Flu-Oct 2020 Herpes zoster-Not eligible  ______________________ labs RPR- NR 09/18/2018 HEPC ab- NR 09/18/18 Lipid- 12/20/18 - ( TC 213, TGL 219, HDL 40, LDL 128) HIV VL < 20 09/18/18 Cd4- 886  quantiferon Gold- ordered 05/07/19   Preventive  Dental- March 2021 Colonoscopy Opthal-Feb 2020 cervical pap Mammogram- Dec 2019 N  Dexa Scan -2016 (-1.9 spine L1-L4 Hip L -1.7  Impression/Recommendation ? 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   ? ___HGSIL-VIN II- followed by Gyn  Encouraged patient to take Corona Virus vaccine  Pt will follow up with Dr.Fitzgerald at [facility] clinic for HIV and Primary care  ________________________________________________ Discussed with patient

## 2019-05-07 NOTE — Patient Instructions (Addendum)
You are here for follow up of HIV. Today will do labs- you are on Genvoya. Your next visit will be with Dr.Fitzgerald at the [facility] clinic.

## 2019-05-08 LAB — T-HELPER CELLS CD4/CD8 %
% CD 4 Pos. Lymph.: 39.4 % (ref 30.8–58.5)
Absolute CD 4 Helper: 867 /uL (ref 359–1519)
Basophils Absolute: 0.1 10*3/uL (ref 0.0–0.2)
Basos: 1 %
CD3+CD4+ Cells/CD3+CD8+ Cells Bld: 0.78 — ABNORMAL LOW (ref 0.92–3.72)
CD3+CD8+ Cells # Bld: 1109 /uL — ABNORMAL HIGH (ref 109–897)
CD3+CD8+ Cells NFr Bld: 50.4 % — ABNORMAL HIGH (ref 12.0–35.5)
EOS (ABSOLUTE): 0.3 10*3/uL (ref 0.0–0.4)
Eos: 4 %
Hematocrit: 43.2 % (ref 34.0–46.6)
Hemoglobin: 15.4 g/dL (ref 11.1–15.9)
Immature Grans (Abs): 0.1 10*3/uL (ref 0.0–0.1)
Immature Granulocytes: 1 %
Lymphocytes Absolute: 2.2 10*3/uL (ref 0.7–3.1)
Lymphs: 29 %
MCH: 35.6 pg — ABNORMAL HIGH (ref 26.6–33.0)
MCHC: 35.6 g/dL (ref 31.5–35.7)
MCV: 100 fL — ABNORMAL HIGH (ref 79–97)
Monocytes Absolute: 0.6 10*3/uL (ref 0.1–0.9)
Monocytes: 7 %
Neutrophils Absolute: 4.3 10*3/uL (ref 1.4–7.0)
Neutrophils: 58 %
Platelets: 285 10*3/uL (ref 150–450)
RBC: 4.32 x10E6/uL (ref 3.77–5.28)
RDW: 11.8 % (ref 11.7–15.4)
WBC: 7.4 10*3/uL (ref 3.4–10.8)

## 2019-05-08 LAB — RPR: RPR Ser Ql: NONREACTIVE

## 2019-05-08 LAB — HIV-1 RNA QUANT-NO REFLEX-BLD
HIV 1 RNA Quant: <20 copies/mL
LOG10 HIV-1 RNA: UNDETERMINED log10copy/mL

## 2019-05-09 ENCOUNTER — Ambulatory Visit: Payer: BC Managed Care – PPO | Admitting: Infectious Diseases

## 2019-05-09 LAB — QUANTIFERON-TB GOLD PLUS (RQFGPL)
QuantiFERON Mitogen Value: >10 IU/mL
QuantiFERON Nil Value: 0.03 IU/mL
QuantiFERON TB1 Ag Value: 0.02 IU/mL
QuantiFERON TB2 Ag Value: 0.02 IU/mL

## 2019-05-09 LAB — QUANTIFERON-TB GOLD PLUS: QuantiFERON-TB Gold Plus: NEGATIVE

## 2019-05-31 ENCOUNTER — Ambulatory Visit (INDEPENDENT_AMBULATORY_CARE_PROVIDER_SITE_OTHER)
Admission: RE | Admit: 2019-05-31 | Discharge: 2019-05-31 | Disposition: A | Discharge status: Home / Self Care | Payer: BC Managed Care – PPO | Source: Ambulatory Visit

## 2019-05-31 DIAGNOSIS — R1032 Left lower quadrant pain: Secondary | ICD-10-CM

## 2019-05-31 MED ORDER — DICYCLOMINE HCL 20 MG PO TABS: 20.0000 mg | ORAL_TABLET | Freq: Two times a day (BID) | ORAL | 0 refills | Status: AC

## 2019-05-31 NOTE — Discharge Instructions (Signed)
Start bentyl as directed. Bland diet, advance as tolerated. Keep hydrated, urine should be clear to pale yellow in color. If not worsening of symptoms, but symptoms do not resolve, follow up with PCP for further evaluation. If having worsening symptoms with fever, nausea/vomiting, diarrhea, blood in bowel movement, severe abdominal pain, go to the ED for further evaluation needed.  [facility] Urgent Care at [facility]) [address] [place], [place], [place] [address] [phone]  [facility] at [facility] [address] [place], [place] [address] [phone]  [facility] Urgent Care at [facility] [address] [place], [place] [address] [phone]  [facility]gent Care at [facility] [address], [place], [place] [address] [phone]  [facility] Urgent Care at [facility] [address] [place], [place] [address] [phone]  [facility] at [facility] [address], [place], [place] [address] [phone]

## 2019-05-31 NOTE — ED Provider Notes (Signed)
Virtual Visit via Video Note:  [name]  initiated request for Telemedicine visit with [facility] Urgent [facility] team. I connected with Clare Charon  on 05/31/2019 at 1:41 PM  for a synchronized telemedicine visit using a video enabled HIPPA compliant telemedicine application. I verified that I am speaking with Clare Charon  using two identifiers. Neomi Laidler Doree Fudge, PA-C  was physically located in a [facility] Urgent care site and ILIZA BLANKENBECKLER was located at a different location.   The limitations of evaluation and management by telemedicine as well as the availability of in-person appointments were discussed. Patient was informed that she  may incur a bill ( including co-pay) for this virtual visit encounter. Clare Charon  expressed understanding and gave verbal consent to proceed with virtual visit.     History of Present Illness:Jordan Frank  is a 49 y.o. female presents with 1 week history of LLQ pain. States this started after having episodes of diarrhea. Now with normal BMs. States LLQ pain is constant, dull and tender to palpation. Pain improves with BM and passing flatus. Denies nausea/vomiting. Denies fever, chills, body ache. Denies melena, hematochezia. Denies history of IBS, crohns, ulcerative colitis. Denies history of chronic constipation. Denies history of colonoscopy. Denies family history of colon cancer.   Past Medical History:  Diagnosis Date  . GERD (gastroesophageal reflux disease)    OCC-NO MEDS  . HIV (human immunodeficiency virus infection) (HCC)    DX 1994  . Hypothyroidism   . VIN II (vulvar intraepithelial neoplasia II) 02/06/2018    Allergies  Allergen Reactions  . Amoxicillin Rash    Has patient had a PCN reaction causing immediate rash, facial/tongue/throat swelling, SOB or lightheadedness with hypotension: Yes Has patient had a PCN reaction causing severe rash involving mucus membranes or skin necrosis: No Has patient had a PCN reaction that required  hospitalization: No Has patient had a PCN reaction occurring within the last 10 years: No If all of the above answers are "NO", then may proceed with Cephalosporin use.   . Ciprofloxacin Rash  . Codeine Rash        Observations/Objective: General: Well appearing, nontoxic, no acute distress. Sitting comfortably, no obvious pain Head: Normocephalic, atraumatic Eye: No conjunctival injection, eyelid swelling. EOMI ENT: Mucus membranes moist, no lip cracking. No obvious nasal drainage. Pulm: Speaking in full sentences without difficulty. Normal effort. No respiratory distress, accessory muscle use. Neuro: Normal mental status. Alert and oriented x 3.  Assessment and Plan: Patient without fever, nausea, vomiting. Diarrhea resolved. Still tolerating oral intake. Low suspicion for acute abdomen at this time. Discussed given age, without history of chronic constipation, low suspicion for diverticulitis at this time. Discussed may be increased mobility/irritation from recent diarrheal episodes. Will start bentyl at this time. Probiotics as needed. Return precautions given. Patient expresses understanding and agrees to plan.  Follow Up Instructions:    I discussed the assessment and treatment plan with the patient. The patient was provided an opportunity to ask questions and all were answered. The patient agreed with the plan and demonstrated an understanding of the instructions.   The patient was advised to call back or seek an in-person evaluation if the symptoms worsen or if the condition fails to improve as anticipated.  I provided 10 minutes of non-face-to-face time during this encounter.    Belinda Fisher, PA-C  05/31/2019 1:41 PM      Belinda Fisher, PA-C 05/31/19 1424

## 2019-11-28 ENCOUNTER — Other Ambulatory Visit: Payer: Self-pay

## 2019-11-28 DIAGNOSIS — B20 Human immunodeficiency virus [HIV] disease: Secondary | ICD-10-CM

## 2019-11-28 MED ORDER — GENVOYA 150-150-200-10 MG PO TABS: 1.0000 | ORAL_TABLET | Freq: Every day | ORAL | 5 refills | Status: DC

## 2021-07-07 ENCOUNTER — Other Ambulatory Visit: Payer: Self-pay | Admitting: Infectious Diseases

## 2021-07-07 DIAGNOSIS — Z1231 Encounter for screening mammogram for malignant neoplasm of breast: Secondary | ICD-10-CM

## 2021-08-04 ENCOUNTER — Ambulatory Visit
Admission: RE | Admit: 2021-08-04 | Discharge: 2021-08-04 | Disposition: A | Discharge status: Home / Self Care | Payer: BC Managed Care – PPO | Source: Ambulatory Visit | Attending: Infectious Diseases | Admitting: Infectious Diseases

## 2021-08-04 DIAGNOSIS — Z1231 Encounter for screening mammogram for malignant neoplasm of breast: Secondary | ICD-10-CM | POA: Insufficient documentation

## 2021-08-05 ENCOUNTER — Inpatient Hospital Stay
Admission: RE | Admit: 2021-08-05 | Discharge: 2021-08-05 | Disposition: A | Discharge status: Home / Self Care | Payer: Self-pay | Source: Ambulatory Visit | Attending: *Deleted | Admitting: *Deleted

## 2021-08-05 ENCOUNTER — Other Ambulatory Visit: Payer: Self-pay | Admitting: *Deleted

## 2021-08-05 DIAGNOSIS — Z1231 Encounter for screening mammogram for malignant neoplasm of breast: Secondary | ICD-10-CM

## 2021-12-08 ENCOUNTER — Encounter: Payer: BC Managed Care – PPO | Admitting: Obstetrics and Gynecology

## 2022-01-14 ENCOUNTER — Encounter: Payer: Self-pay | Admitting: Obstetrics and Gynecology

## 2022-01-14 ENCOUNTER — Ambulatory Visit (INDEPENDENT_AMBULATORY_CARE_PROVIDER_SITE_OTHER): Payer: BC Managed Care – PPO | Admitting: Obstetrics and Gynecology

## 2022-01-14 VITALS — BP 133/85 | HR 97 | Resp 16 | Ht 59.0 in | Wt 168.9 lb

## 2022-01-14 DIAGNOSIS — K644 Residual hemorrhoidal skin tags: Secondary | ICD-10-CM | POA: Diagnosis not present

## 2022-01-14 DIAGNOSIS — N901 Moderate vulvar dysplasia: Secondary | ICD-10-CM | POA: Diagnosis not present

## 2022-01-14 NOTE — Progress Notes (Signed)
GYNECOLOGY PROGRESS NOTE  Subjective:    Patient ID: Jordan Frank, female    DOB: [birth year], 51 y.o.   MRN: [id number]  HPI  Patient is a 51 y.o. [id number] female who presents for Vulvar intraepithelial neoplasia II. She was referred by Dr.Fitzpatrick of [facility]. Patient reports that she was last seen by Dr.  Teena Irani in December 2022 for a wellness exam and surveillance of her VIN. Also with a history of lichen sclerosis. Denies complaints today.    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  She  has a past surgical history that includes Abdominal hysterectomy; Tubal ligation; Cholecystectomy; Appendectomy; and Lesion removal (N/A, 05/12/2017).  Her family history includes Cancer in an other family member; Heart disease in an other family member; Hypertension in her father; Thyroid disease in an other family member.  She  reports that she quit smoking about 5 years ago. Her smoking use included cigarettes. She has a 5.00 pack-year smoking history. She has never used smokeless tobacco. She reports that she does not drink alcohol and does not use drugs.  Current Outpatient Medications on File Prior to Visit  Medication Sig Dispense Refill   ACIDOPHILUS LACTOBACILLUS PO Take 1 capsule by mouth daily.     Cholecalciferol (VITAMIN D) 2000 units tablet Take 2,000 Units by mouth daily.     clobetasol ointment (TEMOVATE) 0.05 % Apply 1 application topically daily as needed for rash.  1   dicyclomine (BENTYL) 20 MG tablet Take 1 tablet (20 mg total) by mouth 2 (two) times daily. 20 tablet 0   DOVATO 50-300 MG tablet Take 1 tablet by mouth daily.     levocetirizine (XYZAL) 5 MG tablet Take 5 mg by mouth every evening.      levothyroxine (SYNTHROID) 112 MCG tablet Take 112 mcg by mouth daily before breakfast.     No current facility-administered medications on file prior to visit.   She is allergic to amoxicillin, ciprofloxacin, and codeine..  Review of Systems Pertinent items noted in HPI and remainder of comprehensive ROS otherwise negative.   Objective:   Blood pressure 133/85, pulse 97, resp. rate 16, height 4\' 11"  (1.499 m), weight 168 lb 14.4 oz (76.6 kg), last menstrual period 05/06/2014. Body mass index is 34.11 kg/m. General appearance: alert and no distress Abdomen: soft, non-tender; bowel sounds normal; no masses,  no organomegaly Pelvic: external genitalia normal with the exception of 2 small sessile wart-like lesions, ~ 2-3 mm in size on left buttocks near perineum.  Introitus with noted lichens vs atrophic vaginal tissue.  Extremities: extremities normal, atraumatic, no cyanosis or edema Neurologic: Grossly normal   Assessment:   1. VIN II (vulvar intraepithelial neoplasia II)   2. Skin tag of perianal region      Plan:   Colposcopic view of the vulva appears normal (see procedure note below). Continue yearly surveillance.  Will also continue to follow up 2 small warts.    GYNECOLOGY OFFICE COLPOSCOPY PROCEDURE NOTE  51 y.o. 56 here for colposcopy for  history of VIN II . Discussed role for HPV in cervical dysplasia, need for surveillance.  Patient gave informed written consent, time out was performed.  Placed in lithotomy position. Vulva coated with application of acetic acid.   Colposcopy adequate? Yes.  2 small flat sessile warts noted at base of buttock, no absorption of acetowhite. No corresponding biopsies obtained.    Chaperone was present during entire procedure.  Patient was given post procedure instructions.  Routine preventative health maintenance measures emphasized.   Rubie Maid, MD [facility] at [facility], [place] Jenny, [place] at [facility]

## 2022-01-16 ENCOUNTER — Encounter: Payer: Self-pay | Admitting: Obstetrics and Gynecology

## 2022-01-21 ENCOUNTER — Encounter: Payer: Self-pay | Admitting: Obstetrics and Gynecology

## 2022-08-16 ENCOUNTER — Other Ambulatory Visit: Payer: Self-pay | Admitting: Infectious Diseases

## 2022-08-16 DIAGNOSIS — Z1231 Encounter for screening mammogram for malignant neoplasm of breast: Secondary | ICD-10-CM

## 2022-09-16 ENCOUNTER — Ambulatory Visit
Admission: RE | Admit: 2022-09-16 | Discharge: 2022-09-16 | Disposition: A | Discharge status: Home / Self Care | Payer: BC Managed Care – PPO | Source: Ambulatory Visit | Attending: Infectious Diseases | Admitting: Infectious Diseases

## 2022-09-16 DIAGNOSIS — Z1231 Encounter for screening mammogram for malignant neoplasm of breast: Secondary | ICD-10-CM | POA: Insufficient documentation

## 2023-03-20 NOTE — Progress Notes (Deleted)
    GYNECOLOGY OFFICE COLPOSCOPY PROCEDURE NOTE  53 y.o. [id number] here for colposcopy for {Findings; lab pap smear results:16707::"no abnormalities"} pap smear on ***. Discussed role for HPV in cervical dysplasia, need for surveillance.  Patient gave informed written consent, time out was performed.  Placed in lithotomy position. Cervix viewed with speculum and colposcope after application of acetic acid.   Colposcopy adequate? {yes/no:20286}  {Findings; colposcopy:728}; corresponding biopsies obtained.  ECC specimen obtained. All specimens were labeled and sent to pathology.  Chaperone was present during entire procedure.  Patient was given post procedure instructions.  Will follow up pathology and manage accordingly; patient will be contacted with results and recommendations.  Routine preventative health maintenance measures emphasized.    Hildred Laser, MD [facility]

## 2023-03-21 ENCOUNTER — Ambulatory Visit: Payer: 59 | Admitting: Obstetrics and Gynecology

## 2023-03-21 NOTE — Patient Instructions (Incomplete)
 Preventive Care 39-51 Years Old, Female Preventive care refers to lifestyle choices and visits with your health care provider that can promote health and wellness. Preventive care visits are also called wellness exams. What can I expect for my preventive care visit? Counseling Your health care provider may ask you questions about your: Medical history, including: Past medical problems. Family medical history. Pregnancy history. Current health, including: Menstrual cycle. Method of birth control. Emotional well-being. Home life and relationship well-being. Sexual activity and sexual health. Lifestyle, including: Alcohol, nicotine or tobacco, and drug use. Access to firearms. Diet, exercise, and sleep habits. Work and work Astronomer. Sunscreen use. Safety issues such as seatbelt and bike helmet use. Physical exam Your health care provider will check your: Height and weight. These may be used to calculate your BMI (body mass index). BMI is a measurement that tells if you are at a healthy weight. Waist circumference. This measures the distance around your waistline. This measurement also tells if you are at a healthy weight and may help predict your risk of certain diseases, such as type 2 diabetes and high blood pressure. Heart rate and blood pressure. Body temperature. Skin for abnormal spots. What immunizations do I need?  Vaccines are usually given at various ages, according to a schedule. Your health care provider will recommend vaccines for you based on your age, medical history, and lifestyle or other factors, such as travel or where you work. What tests do I need? Screening Your health care provider may recommend screening tests for certain conditions. This may include: Lipid and cholesterol levels. Diabetes screening. This is done by checking your blood sugar (glucose) after you have not eaten for a while (fasting). Pelvic exam and Pap test. Hepatitis B test. Hepatitis C  test. HIV (human immunodeficiency virus) test. STI (sexually transmitted infection) testing, if you are at risk. Lung cancer screening. Colorectal cancer screening. Mammogram. Talk with your health care provider about when you should start having regular mammograms. This may depend on whether you have a family history of breast cancer. BRCA-related cancer screening. This may be done if you have a family history of breast, ovarian, tubal, or peritoneal cancers. Bone density scan. This is done to screen for osteoporosis. Talk with your health care provider about your test results, treatment options, and if necessary, the need for more tests. Follow these instructions at home: Eating and drinking  Eat a diet that includes fresh fruits and vegetables, whole grains, lean protein, and low-fat dairy products. Take vitamin and mineral supplements as recommended by your health care provider. Do not drink alcohol if: Your health care provider tells you not to drink. You are pregnant, may be pregnant, or are planning to become pregnant. If you drink alcohol: Limit how much you have to 0-1 drink a day. Know how much alcohol is in your drink. In the [place] one drink equals one 12 oz bottle of beer (355 mL), one 5 oz glass of wine (148 mL), or one 1 oz glass of hard liquor (44 mL). Lifestyle Brush your teeth every morning and night with fluoride toothpaste. Floss one time each day. Exercise for at least 30 minutes 5 or more days each week. Do not use any products that contain nicotine or tobacco. These products include cigarettes, chewing tobacco, and vaping devices, such as e-cigarettes. If you need help quitting, ask your health care provider. Do not use drugs. If you are sexually active, practice safe sex. Use a condom or other form of protection to  prevent STIs. If you do not wish to become pregnant, use a form of birth control. If you plan to become pregnant, see your health care provider for a  prepregnancy visit. Take aspirin only as told by your health care provider. Make sure that you understand how much to take and what form to take. Work with your health care provider to find out whether it is safe and beneficial for you to take aspirin daily. Find healthy ways to manage stress, such as: Meditation, yoga, or listening to music. Journaling. Talking to a trusted person. Spending time with friends and family. Minimize exposure to UV radiation to reduce your risk of skin cancer. Safety Always wear your seat belt while driving or riding in a vehicle. Do not drive: If you have been drinking alcohol. Do not ride with someone who has been drinking. When you are tired or distracted. While texting. If you have been using any mind-altering substances or drugs. Wear a helmet and other protective equipment during sports activities. If you have firearms in your house, make sure you follow all gun safety procedures. Seek help if you have been physically or sexually abused. What's next? Visit your health care provider once a year for an annual wellness visit. Ask your health care provider how often you should have your eyes and teeth checked. Stay up to date on all vaccines. This information is not intended to replace advice given to you by your health care provider. Make sure you discuss any questions you have with your health care provider. Document Revised: 07/15/2020 Document Reviewed: 07/15/2020 Elsevier Patient Education  2024 Elsevier Inc. Breast Self-Awareness Breast self-awareness is knowing how your breasts look and feel. You need to: Check your breasts on a regular basis. Tell your doctor about any changes. Become familiar with the look and feel of your breasts. This can help you catch a breast problem while it is still small and can be treated. You should do breast self-exams even if you have breast implants. What you need: A mirror. A well-lit room. A pillow or other  soft object. How to do a breast self-exam Follow these steps to do a breast self-exam: Look for changes  Take off all the clothes above your waist. Stand in front of a mirror in a room with good lighting. Put your hands down at your sides. Compare your breasts in the mirror. Look for any difference between them, such as: A difference in shape. A difference in size. Wrinkles, dips, and bumps in one breast and not the other. Look at each breast for changes in the skin, such as: Redness. Scaly areas. Skin that has gotten thicker. Dimpling. Open sores (ulcers). Look for changes in your nipples, such as: Fluid coming out of a nipple. Fluid around a nipple. Bleeding. Dimpling. Redness. A nipple that looks pushed in (retracted), or that has changed position. Feel for changes Lie on your back. Feel each breast. To do this: Pick a breast to feel. Place a pillow under the shoulder closest to that breast. Put the arm closest to that breast behind your head. Feel the nipple area of that breast using the hand of your other arm. Feel the area with the pads of your three middle fingers by making small circles with your fingers. Use light, medium, and firm pressure. Continue the overlapping circles, moving downward over the breast. Keep making circles with your fingers. Stop when you feel your ribs. Start making circles with your fingers again, this time going  upward until you reach your collarbone. Then, make circles outward across your breast and into your armpit area. Squeeze your nipple. Check for discharge and lumps. Repeat these steps to check your other breast. Sit or stand in the tub or shower. With soapy water on your skin, feel each breast the same way you did when you were lying down. Write down what you find Writing down what you find can help you remember what to tell your doctor. Write down: What is normal for each breast. Any changes you find in each breast. These  include: The kind of changes you find. A tender or painful breast. Any lump you find. Write down its size and where it is. When you last had your monthly period (menstrual cycle). General tips If you are breastfeeding, the best time to check your breasts is after you feed your baby or after you use a breast pump. If you get monthly bleeding, the best time to check your breasts is 5-7 days after your monthly cycle ends. With time, you will become comfortable with the self-exam. You will also start to know if there are changes in your breasts. Contact a doctor if: You see a change in the shape or size of your breasts or nipples. You see a change in the skin of your breast or nipples, such as red or scaly skin. You have fluid coming from your nipples that is not normal. You find a new lump or thick area. You have breast pain. You have any concerns about your breast health. Summary Breast self-awareness includes looking for changes in your breasts and feeling for changes within your breasts. You should do breast self-awareness in front of a mirror in a well-lit room. If you get monthly periods (menstrual cycles), the best time to check your breasts is 5-7 days after your period ends. Tell your doctor about any changes you see in your breasts. Changes include changes in size, changes on the skin, painful or tender breasts, or fluid from your nipples that is not normal. This information is not intended to replace advice given to you by your health care provider. Make sure you discuss any questions you have with your health care provider. Document Revised: 06/24/2021 Document Reviewed: 11/19/2020 Elsevier Patient Education  2024 [employer].

## 2023-03-22 ENCOUNTER — Ambulatory Visit: Payer: 59 | Admitting: Obstetrics and Gynecology

## 2023-04-27 ENCOUNTER — Other Ambulatory Visit (HOSPITAL_COMMUNITY)
Admission: RE | Admit: 2023-04-27 | Discharge: 2023-04-27 | Disposition: A | Discharge status: Home / Self Care | Source: Ambulatory Visit | Attending: Obstetrics and Gynecology | Admitting: Obstetrics and Gynecology

## 2023-04-27 ENCOUNTER — Ambulatory Visit (INDEPENDENT_AMBULATORY_CARE_PROVIDER_SITE_OTHER): Payer: 59 | Admitting: Obstetrics and Gynecology

## 2023-04-27 ENCOUNTER — Encounter: Payer: Self-pay | Admitting: Obstetrics and Gynecology

## 2023-04-27 VITALS — BP 114/79 | HR 86 | Resp 16 | Ht 59.0 in | Wt 178.7 lb

## 2023-04-27 DIAGNOSIS — B20 Human immunodeficiency virus [HIV] disease: Secondary | ICD-10-CM

## 2023-04-27 DIAGNOSIS — Z01419 Encounter for gynecological examination (general) (routine) without abnormal findings: Secondary | ICD-10-CM | POA: Insufficient documentation

## 2023-04-27 DIAGNOSIS — Z1231 Encounter for screening mammogram for malignant neoplasm of breast: Secondary | ICD-10-CM

## 2023-04-27 DIAGNOSIS — Z124 Encounter for screening for malignant neoplasm of cervix: Secondary | ICD-10-CM

## 2023-04-27 DIAGNOSIS — L900 Lichen sclerosus et atrophicus: Secondary | ICD-10-CM

## 2023-04-27 DIAGNOSIS — L309 Dermatitis, unspecified: Secondary | ICD-10-CM

## 2023-04-27 DIAGNOSIS — N901 Moderate vulvar dysplasia: Secondary | ICD-10-CM

## 2023-04-27 MED ORDER — ESTRADIOL 0.1 MG/GM VA CREA: 1.0000 | TOPICAL_CREAM | VAGINAL | 12 refills | Status: AC

## 2023-04-27 NOTE — Progress Notes (Unsigned)
 ANNUAL PREVENTATIVE CARE GYNECOLOGY  ENCOUNTER NOTE  Subjective:       Jordan Frank is a 53 y.o. [phone] female here for a routine annual gynecologic exam. She has a PMH of VIN II, lichen sclerosis, and HIV. The patient is sexually active. The patient is not taking hormone replacement therapy. Patient denies post-menopausal vaginal bleeding. The patient wears seatbelts: yes. The patient participates in regular exercise: yes. Has the patient ever been transfused or tattooed?: no. The patient reports that there is not domestic violence in her life.  Current complaints: 1.  She has no new concerns.    Gynecologic History Patient's last menstrual period was 05/06/2014 (approximate). Contraception: status post hysterectomy Last Pap: 12/2020. Results were: normal Last mammogram: 09/16/2022. Results were: normal Last Colonoscopy: 11/14/2019: 10 years Last Dexa Scan: 03/28/2014   Obstetric History OB History  Gravida Para Term Preterm AB Living  3    1 2   SAB IAB Ectopic Multiple Live Births  1        # Outcome Date GA Lbr Len/2nd Weight Sex Type Anes PTL Lv  3 Gravida           2 Gravida           1 SAB             Past Medical History:  Diagnosis Date   GERD (gastroesophageal reflux disease)    OCC-NO MEDS   HIV (human immunodeficiency virus infection) (HCC)    DX 1994   Hypothyroidism    VIN II (vulvar intraepithelial neoplasia II) 02/06/2018    Family History  Problem Relation Age of Onset   Hypertension Father    Heart disease Other    Cancer Other    Thyroid disease Other     Past Surgical History:  Procedure Laterality Date   APPENDECTOMY     CHOLECYSTECTOMY     LAPAROSCOPIC SUPRACERVICAL HYSTERECTOMY     ovaries remain   LESION REMOVAL N/A 05/12/2017   Procedure: EXCISION VAGINAL LESION;  Surgeon: Schermerhorn, Ihor Austin, MD;  Location: [facility] ORS;  Service: Gynecology;  Laterality: N/A;   TUBAL LIGATION      Social History   Socioeconomic History    Marital status: Single    Spouse name: Not on file   Number of children: Not on file   Years of education: Not on file   Highest education level: Not on file  Occupational History   Not on file  Tobacco Use   Smoking status: Former    Current packs/day: 0.00    Average packs/day: 0.3 packs/day for 20.0 years (5.0 ttl pk-yrs)    Types: Cigarettes    Start date: 05/05/1996    Quit date: 05/05/2016    Years since quitting: 6.9   Smokeless tobacco: Never  Vaping Use   Vaping status: Never Used  Substance and Sexual Activity   Alcohol use: Never   Drug use: Never   Sexual activity: Yes  Other Topics Concern   Not on file  Social History Narrative   Not on file   Social Drivers of Health   Financial Resource Strain: Low Risk  (12/13/2022)   Received from [facility]   Overall Financial Resource Strain (CARDIA)    Difficulty of Paying Living Expenses: Not hard at all  Food Insecurity: No Food Insecurity (12/13/2022)   Received from [facility]   Hunger Vital Sign    Worried About Running Out of Food in the  Last Year: Never true    Ran Out of Food in the Last Year: Never true  Transportation Needs: No Transportation Needs (12/13/2022)   Received from [facility]    In the past 12 months, has lack of transportation kept you from medical appointments or from getting medications?: No    Lack of Transportation (Non-Medical): No  Physical Activity: Not on file  Stress: Not on file  Social Connections: Not on file  Intimate Partner Violence: Not on file    Current Outpatient Medications on File Prior to Visit  Medication Sig Dispense Refill   ACIDOPHILUS LACTOBACILLUS PO Take 1 capsule by mouth daily.     Cholecalciferol (VITAMIN D) 2000 units tablet Take 2,000 Units by mouth daily.     clobetasol ointment (TEMOVATE) 0.05 % Apply 1 application topically daily as needed for rash.  1   dicyclomine  (BENTYL) 20 MG tablet Take 1 tablet (20 mg total) by mouth 2 (two) times daily. 20 tablet 0   DOVATO 50-300 MG tablet Take 1 tablet by mouth daily.     levocetirizine (XYZAL) 5 MG tablet Take 5 mg by mouth every evening.     levothyroxine (SYNTHROID) 112 MCG tablet Take 112 mcg by mouth daily before breakfast.     No current facility-administered medications on file prior to visit.    Allergies  Allergen Reactions   Amoxicillin Rash    Has patient had a PCN reaction causing immediate rash, facial/tongue/throat swelling, SOB or lightheadedness with hypotension: Yes Has patient had a PCN reaction causing severe rash involving mucus membranes or skin necrosis: No Has patient had a PCN reaction that required hospitalization: No Has patient had a PCN reaction occurring within the last 10 years: No If all of the above answers are "NO", then may proceed with Cephalosporin use.    Ciprofloxacin Rash   Codeine Rash      Review of Systems ROS Review of Systems - General ROS: negative for - chills, fatigue, fever, hot flashes, night sweats, weight gain or weight loss Psychological ROS: negative for - anxiety, decreased libido, depression, mood swings, physical abuse or sexual abuse Ophthalmic ROS: negative for - blurry vision, eye pain or loss of vision ENT ROS: negative for - headaches, hearing change, visual changes or vocal changes Allergy and Immunology ROS: negative for - hives, itchy/watery eyes or seasonal allergies Hematological and Lymphatic ROS: negative for - bleeding problems, bruising, swollen lymph nodes or weight loss Endocrine ROS: negative for - galactorrhea, hair pattern changes, hot flashes, malaise/lethargy, mood swings, palpitations, polydipsia/polyuria, skin changes, temperature intolerance or unexpected weight changes Breast ROS: negative for - new or changing breast lumps or nipple discharge Respiratory ROS: negative for - cough or shortness of breath Cardiovascular  ROS: negative for - chest pain, irregular heartbeat, palpitations or shortness of breath Gastrointestinal ROS: no abdominal pain, change in bowel habits, or black or bloody stools Genito-Urinary ROS: no dysuria, trouble voiding, or hematuria. Has noted some mild vulvar itching on occasion over the past few days.  Musculoskeletal ROS: negative for - joint pain or joint stiffness Neurological ROS: negative for - bowel and bladder control changes Dermatological ROS: negative for rash and skin lesion changes   Objective:   BP 114/79   Pulse 86   Resp 16   Ht 4\' 11"  (1.499 m)   Wt 178 lb 11.2 oz (81.1 kg)   LMP 05/06/2014 (Approximate)   BMI 36.09 kg/m  CONSTITUTIONAL: Well-developed,  well-nourished female in no acute distress.  PSYCHIATRIC: Normal mood and affect. Normal behavior. Normal judgment and thought content. NEUROLGIC: Alert and oriented to person, place, and time. Normal muscle tone coordination. No cranial nerve deficit noted. HENT:  Normocephalic, atraumatic, External right and left ear normal. Oropharynx is clear and moist EYES: Conjunctivae and EOM are normal. Pupils are equal, round, and reactive to light. No scleral icterus.  NECK: Normal range of motion, supple, no masses.  Normal thyroid.  SKIN: Skin is warm and dry. No rash noted. Not diaphoretic. No erythema. No pallor. CARDIOVASCULAR: Normal heart rate noted, regular rhythm, no murmur. RESPIRATORY: Clear to auscultation bilaterally. Effort and breath sounds normal, no problems with respiration noted. BREASTS: Symmetric in size. No masses, skin changes, nipple drainage, or lymphadenopathy. ABDOMEN: Soft, normal bowel sounds, no distention noted.  No tenderness, rebound or guarding.  BLADDER: Normal PELVIC:  Bladder no bladder distension noted  Urethra: normal appearing urethra with no masses, tenderness or lesions  Vulva: vulva with areas that appear to be mildly erythemic at labia major with possible lichenification  vs atrophic dermatitis  Vagina: atrophic with stippling of apex of vagina, no discharge or lesions  Cervix: atrophic cervix with stipppling. No discharge or lesions  Uterus: surgically absent  Adnexa: normal adnexa in size, nontender and no masses  RV: External Exam NormaI, No Rectal Masses, and Normal Sphincter tone  MUSCULOSKELETAL: Normal range of motion. No tenderness.  No cyanosis, clubbing, or edema.  2+ distal pulses. LYMPHATIC: No Axillary, Supraclavicular, or Inguinal Adenopathy.   Labs: Performed by PCP   Assessment:   1. Encounter for well woman exam with routine gynecological exam   2. Encounter for screening mammogram for malignant neoplasm of breast   3. Cervical cancer screening   4. HIV disease (HCC)   5. VIN II (vulvar intraepithelial neoplasia II)   6. Vulvar dermatitis      Plan:  - Pap: Pap Co Test performed today. Recommend continuing q3 year screens based on ASCCP guidelines due to history of HIV.  - Mammogram: Ordered.  - Colon Screening:   UTD - Labs:  performed by PCP.  - Routine preventative health maintenance measures emphasized:  Self Breast Exams, Exercise/Diet/Weight control, and Stress Management - Flu vaccine status: 11/18/2022 - Area of vulvar dermatitis, unclear if due to lichens sclerosis or atrophy (as moderate atrophy also noted vaginally).  Prescribed Estrace cream, to utilize twice weekly. If no improvement in symptoms and area, would recommend addition of clobetasol. Recommend f/u in 6 weeks. To follow up sooner if symptoms worsen.  - Return to Clinic - 1 Year   Hildred Laser, MD [facility]

## 2023-04-28 DIAGNOSIS — L900 Lichen sclerosus et atrophicus: Secondary | ICD-10-CM | POA: Insufficient documentation

## 2023-05-02 ENCOUNTER — Encounter: Payer: Self-pay | Admitting: Obstetrics and Gynecology

## 2023-05-02 LAB — CYTOLOGY - PAP
Adequacy: ABSENT
Comment: NEGATIVE
Diagnosis: NEGATIVE
High risk HPV: NEGATIVE

## 2023-05-04 ENCOUNTER — Other Ambulatory Visit: Payer: Self-pay

## 2023-05-04 DIAGNOSIS — L900 Lichen sclerosus et atrophicus: Secondary | ICD-10-CM

## 2023-05-04 MED ORDER — CLOBETASOL PROPIONATE 0.05 % EX OINT: 1.0000 | TOPICAL_OINTMENT | Freq: Every day | CUTANEOUS | 1 refills | Status: DC | PRN

## 2023-05-04 NOTE — Telephone Encounter (Signed)
 Yes, that's fine

## 2023-05-04 NOTE — Telephone Encounter (Signed)
 Can I refill the Clobetasol under your name?

## 2023-08-07 ENCOUNTER — Other Ambulatory Visit: Payer: Self-pay | Admitting: Infectious Diseases

## 2023-08-07 DIAGNOSIS — Z1231 Encounter for screening mammogram for malignant neoplasm of breast: Secondary | ICD-10-CM

## 2023-09-19 ENCOUNTER — Ambulatory Visit
Admission: RE | Admit: 2023-09-19 | Discharge: 2023-09-19 | Disposition: A | Discharge status: Home / Self Care | Source: Ambulatory Visit | Attending: Infectious Diseases | Admitting: Infectious Diseases

## 2023-09-19 DIAGNOSIS — Z1231 Encounter for screening mammogram for malignant neoplasm of breast: Secondary | ICD-10-CM | POA: Insufficient documentation

## 2024-03-07 ENCOUNTER — Other Ambulatory Visit: Payer: Self-pay

## 2024-03-07 DIAGNOSIS — L900 Lichen sclerosus et atrophicus: Secondary | ICD-10-CM

## 2024-03-07 MED ORDER — CLOBETASOL PROPIONATE 0.05 % EX OINT: 1.0000 | TOPICAL_OINTMENT | Freq: Every day | CUTANEOUS | 0 refills | Status: AC | PRN

## 2024-03-07 NOTE — Telephone Encounter (Signed)
 Pt is scheduled with Damien Parsley CNM for annual exam 04/30/24.  One refill authorized until appointment.

## 2024-04-30 ENCOUNTER — Ambulatory Visit: Admitting: Certified Nurse Midwife
